# Patient Record
Sex: Male | Born: 1960 | Race: White | Hispanic: No | Marital: Single | Smoking: Current every day smoker
Health system: Southern US, Community
[De-identification: ages and names within clinical notes are randomized; demographics above are authoritative.]

## PROBLEM LIST (undated history)

## (undated) DIAGNOSIS — J45909 Unspecified asthma, uncomplicated: Secondary | ICD-10-CM

## (undated) DIAGNOSIS — E119 Type 2 diabetes mellitus without complications: Secondary | ICD-10-CM

## (undated) DIAGNOSIS — I10 Essential (primary) hypertension: Secondary | ICD-10-CM

## (undated) HISTORY — PX: HERNIA REPAIR: SHX51

---

## 2009-05-08 ENCOUNTER — Emergency Department: Payer: Self-pay | Admitting: Emergency Medicine

## 2009-10-17 ENCOUNTER — Emergency Department: Payer: Self-pay | Admitting: Emergency Medicine

## 2010-03-08 ENCOUNTER — Emergency Department: Payer: Self-pay | Admitting: Emergency Medicine

## 2010-12-26 ENCOUNTER — Emergency Department: Payer: Self-pay | Admitting: Emergency Medicine

## 2011-03-24 ENCOUNTER — Emergency Department: Payer: Self-pay | Admitting: Emergency Medicine

## 2011-04-19 ENCOUNTER — Emergency Department: Payer: Self-pay | Admitting: *Deleted

## 2011-07-06 ENCOUNTER — Emergency Department: Payer: Self-pay | Admitting: Emergency Medicine

## 2011-07-06 LAB — COMPREHENSIVE METABOLIC PANEL
Albumin: 4 g/dL (ref 3.4–5.0)
Anion Gap: 12 (ref 7–16)
Bilirubin,Total: 0.4 mg/dL (ref 0.2–1.0)
Calcium, Total: 9.2 mg/dL (ref 8.5–10.1)
Creatinine: 0.65 mg/dL (ref 0.60–1.30)
Glucose: 131 mg/dL — ABNORMAL HIGH (ref 65–99)
Osmolality: 282 (ref 275–301)
Potassium: 4 mmol/L (ref 3.5–5.1)
SGOT(AST): 35 U/L (ref 15–37)
Sodium: 141 mmol/L (ref 136–145)
Total Protein: 7.9 g/dL (ref 6.4–8.2)

## 2011-07-06 LAB — CBC
HCT: 46.6 % (ref 40.0–52.0)
HGB: 16.3 g/dL (ref 13.0–18.0)
MCH: 32.8 pg (ref 26.0–34.0)
MCHC: 34.9 g/dL (ref 32.0–36.0)
WBC: 8.5 10*3/uL (ref 3.8–10.6)

## 2011-07-11 ENCOUNTER — Emergency Department: Payer: Self-pay | Admitting: Emergency Medicine

## 2011-07-11 LAB — CBC
HCT: 45.7 % (ref 40.0–52.0)
HGB: 15.7 g/dL (ref 13.0–18.0)
MCH: 32.5 pg (ref 26.0–34.0)
MCHC: 34.4 g/dL (ref 32.0–36.0)
MCV: 95 fL (ref 80–100)
Platelet: 161 10*3/uL (ref 150–440)
WBC: 6.2 10*3/uL (ref 3.8–10.6)

## 2011-07-11 LAB — BASIC METABOLIC PANEL
Calcium, Total: 8.8 mg/dL (ref 8.5–10.1)
Co2: 24 mmol/L (ref 21–32)
EGFR (Non-African Amer.): >60
Glucose: 162 mg/dL — ABNORMAL HIGH (ref 65–99)
Osmolality: 281 (ref 275–301)
Potassium: 4.2 mmol/L (ref 3.5–5.1)

## 2011-08-13 ENCOUNTER — Emergency Department: Payer: Self-pay | Admitting: Emergency Medicine

## 2012-04-02 ENCOUNTER — Emergency Department: Payer: Self-pay | Admitting: Emergency Medicine

## 2012-04-02 LAB — BASIC METABOLIC PANEL
Anion Gap: 7 (ref 7–16)
BUN: 12 mg/dL (ref 7–18)
Chloride: 104 mmol/L (ref 98–107)
Co2: 28 mmol/L (ref 21–32)
Creatinine: 0.85 mg/dL (ref 0.60–1.30)
EGFR (Non-African Amer.): >60
Glucose: 224 mg/dL — ABNORMAL HIGH (ref 65–99)
Sodium: 139 mmol/L (ref 136–145)

## 2012-04-02 LAB — CBC
MCH: 33 pg (ref 26.0–34.0)
MCV: 93 fL (ref 80–100)
Platelet: 167 10*3/uL (ref 150–440)
RDW: 12.5 % (ref 11.5–14.5)
WBC: 7.4 10*3/uL (ref 3.8–10.6)

## 2012-04-02 LAB — TROPONIN I
Troponin-I: <0.02 ng/mL
Troponin-I: <0.02 ng/mL

## 2012-08-23 ENCOUNTER — Emergency Department: Payer: Self-pay | Admitting: Emergency Medicine

## 2012-08-23 LAB — COMPREHENSIVE METABOLIC PANEL
Anion Gap: 6 — ABNORMAL LOW (ref 7–16)
BUN: 10 mg/dL (ref 7–18)
Calcium, Total: 8.1 mg/dL — ABNORMAL LOW (ref 8.5–10.1)
Chloride: 104 mmol/L (ref 98–107)
Creatinine: 0.71 mg/dL (ref 0.60–1.30)
EGFR (African American): >60
EGFR (Non-African Amer.): >60
Glucose: 335 mg/dL — ABNORMAL HIGH (ref 65–99)
Osmolality: 284 (ref 275–301)
Potassium: 3.9 mmol/L (ref 3.5–5.1)
SGOT(AST): 40 U/L — ABNORMAL HIGH (ref 15–37)
SGPT (ALT): 64 U/L (ref 12–78)
Sodium: 136 mmol/L (ref 136–145)
Total Protein: 7.6 g/dL (ref 6.4–8.2)

## 2012-08-23 LAB — URINALYSIS, COMPLETE
Blood: NEGATIVE
Ketone: NEGATIVE
Leukocyte Esterase: NEGATIVE
Nitrite: NEGATIVE
Ph: 6 (ref 4.5–8.0)
Protein: NEGATIVE
RBC,UR: <1 /HPF (ref 0–5)
Specific Gravity: 1.036 (ref 1.003–1.030)
Squamous Epithelial: 1

## 2012-08-23 LAB — DRUG SCREEN, URINE
MDMA (Ecstasy)Ur Screen: NEGATIVE (ref ?–500)
Opiate, Ur Screen: POSITIVE (ref ?–300)
Phencyclidine (PCP) Ur S: NEGATIVE (ref ?–25)
Tricyclic, Ur Screen: NEGATIVE (ref ?–1000)

## 2012-08-23 LAB — CBC
MCHC: 35.1 g/dL (ref 32.0–36.0)
MCV: 94 fL (ref 80–100)
RDW: 12.8 % (ref 11.5–14.5)

## 2012-08-23 LAB — ETHANOL: Ethanol: <3 mg/dL

## 2012-10-10 ENCOUNTER — Emergency Department: Payer: Self-pay | Admitting: Emergency Medicine

## 2012-10-10 LAB — COMPREHENSIVE METABOLIC PANEL
Albumin: 3.8 g/dL (ref 3.4–5.0)
BUN: 12 mg/dL (ref 7–18)
Bilirubin,Total: 0.7 mg/dL (ref 0.2–1.0)
Calcium, Total: 8.8 mg/dL (ref 8.5–10.1)
Chloride: 104 mmol/L (ref 98–107)
Co2: 24 mmol/L (ref 21–32)
Creatinine: 0.78 mg/dL (ref 0.60–1.30)
EGFR (African American): >60
EGFR (Non-African Amer.): >60
Glucose: 305 mg/dL — ABNORMAL HIGH (ref 65–99)
Osmolality: 283 (ref 275–301)
Potassium: 3.8 mmol/L (ref 3.5–5.1)
SGOT(AST): 59 U/L — ABNORMAL HIGH (ref 15–37)
SGPT (ALT): 78 U/L (ref 12–78)
Sodium: 136 mmol/L (ref 136–145)
Total Protein: 7.3 g/dL (ref 6.4–8.2)

## 2012-10-10 LAB — CBC
MCH: 34 pg (ref 26.0–34.0)
Platelet: 204 10*3/uL (ref 150–440)
RBC: 5.1 10*6/uL (ref 4.40–5.90)
RDW: 12.9 % (ref 11.5–14.5)
WBC: 8.6 10*3/uL (ref 3.8–10.6)

## 2012-10-23 ENCOUNTER — Emergency Department: Payer: Self-pay | Admitting: Emergency Medicine

## 2012-11-04 ENCOUNTER — Emergency Department: Payer: Self-pay

## 2012-11-04 LAB — URINALYSIS, COMPLETE
Bacteria: NONE SEEN
Bilirubin,UR: NEGATIVE
Glucose,UR: >=500 mg/dL
Hyaline Cast: 12
Ketone: NEGATIVE
Leukocyte Esterase: NEGATIVE
Nitrite: NEGATIVE
Ph: 5
Protein: 100
RBC,UR: 3 /HPF
Specific Gravity: 1.036
Squamous Epithelial: 3
WBC UR: 3 /HPF

## 2012-11-04 LAB — CBC
HCT: 49.3 %
HGB: 18 g/dL
MCH: 33.5 pg
MCHC: 36.4 g/dL — ABNORMAL HIGH
MCV: 92 fL
Platelet: 210 x10 3/mm 3
RBC: 5.37 x10 6/mm 3
RDW: 13 %
WBC: 13.2 x10 3/mm 3 — ABNORMAL HIGH

## 2012-11-04 LAB — COMPREHENSIVE METABOLIC PANEL
Albumin: 4.3 g/dL (ref 3.4–5.0)
Alkaline Phosphatase: 91 U/L (ref 50–136)
Anion Gap: 13 (ref 7–16)
BUN: 15 mg/dL (ref 7–18)
Bilirubin,Total: 0.6 mg/dL (ref 0.2–1.0)
Calcium, Total: 9.8 mg/dL (ref 8.5–10.1)
Chloride: 99 mmol/L (ref 98–107)
Creatinine: 1.09 mg/dL (ref 0.60–1.30)
EGFR (African American): >60
EGFR (Non-African Amer.): >60
Glucose: 384 mg/dL — ABNORMAL HIGH (ref 65–99)
Osmolality: 276 (ref 275–301)
Potassium: 4.2 mmol/L (ref 3.5–5.1)
Sodium: 129 mmol/L — ABNORMAL LOW (ref 136–145)

## 2012-11-04 LAB — LIPASE, BLOOD: Lipase: 209 U/L (ref 73–393)

## 2012-11-12 ENCOUNTER — Emergency Department: Payer: Self-pay | Admitting: Emergency Medicine

## 2012-11-12 LAB — URINALYSIS, COMPLETE
Bacteria: NONE SEEN
Blood: NEGATIVE
Glucose,UR: >=500 mg/dL (ref 0–75)
Hyaline Cast: 1
Leukocyte Esterase: NEGATIVE
Nitrite: NEGATIVE
Ph: 5 (ref 4.5–8.0)
Protein: 30
RBC,UR: 1 /HPF (ref 0–5)
Specific Gravity: 1.037 (ref 1.003–1.030)
Squamous Epithelial: 1
WBC UR: 1 /HPF (ref 0–5)

## 2012-11-12 LAB — CBC
HCT: 44.7 % (ref 40.0–52.0)
MCHC: 36.1 g/dL — ABNORMAL HIGH (ref 32.0–36.0)
MCV: 92 fL (ref 80–100)
Platelet: 176 10*3/uL (ref 150–440)
RBC: 4.87 10*6/uL (ref 4.40–5.90)
WBC: 7.5 10*3/uL (ref 3.8–10.6)

## 2012-11-12 LAB — COMPREHENSIVE METABOLIC PANEL
Anion Gap: 6 — ABNORMAL LOW (ref 7–16)
BUN: 12 mg/dL (ref 7–18)
Bilirubin,Total: 0.4 mg/dL (ref 0.2–1.0)
Calcium, Total: 9.4 mg/dL (ref 8.5–10.1)
EGFR (African American): >60
EGFR (Non-African Amer.): >60
Glucose: 238 mg/dL — ABNORMAL HIGH (ref 65–99)
Potassium: 3.8 mmol/L (ref 3.5–5.1)
SGPT (ALT): 61 U/L (ref 12–78)
Sodium: 134 mmol/L — ABNORMAL LOW (ref 136–145)
Total Protein: 7.3 g/dL (ref 6.4–8.2)

## 2013-01-01 ENCOUNTER — Emergency Department: Payer: Self-pay | Admitting: Emergency Medicine

## 2013-01-01 LAB — DRUG SCREEN, URINE
Amphetamines, Ur Screen: NEGATIVE (ref ?–1000)
Benzodiazepine, Ur Scrn: NEGATIVE (ref ?–200)
Cannabinoid 50 Ng, Ur ~~LOC~~: NEGATIVE (ref ?–50)
Methadone, Ur Screen: NEGATIVE (ref ?–300)
Opiate, Ur Screen: POSITIVE (ref ?–300)
Phencyclidine (PCP) Ur S: NEGATIVE (ref ?–25)

## 2013-01-01 LAB — URINALYSIS, COMPLETE
Bacteria: NONE SEEN
Blood: NEGATIVE
Ketone: NEGATIVE
Leukocyte Esterase: NEGATIVE
Nitrite: NEGATIVE
Specific Gravity: 1.024 (ref 1.003–1.030)
WBC UR: 1 /HPF (ref 0–5)

## 2013-01-01 LAB — COMPREHENSIVE METABOLIC PANEL
Albumin: 4.1 g/dL (ref 3.4–5.0)
Alkaline Phosphatase: 84 U/L (ref 50–136)
Anion Gap: 5 — ABNORMAL LOW (ref 7–16)
BUN: 12 mg/dL (ref 7–18)
Calcium, Total: 8.8 mg/dL (ref 8.5–10.1)
Chloride: 104 mmol/L (ref 98–107)
Creatinine: 0.72 mg/dL (ref 0.60–1.30)
EGFR (Non-African Amer.): >60
SGPT (ALT): 38 U/L (ref 12–78)
Sodium: 137 mmol/L (ref 136–145)

## 2013-01-01 LAB — TSH: Thyroid Stimulating Horm: 2.02 u[IU]/mL

## 2013-01-01 LAB — CBC
HGB: 16.4 g/dL (ref 13.0–18.0)
MCH: 32.4 pg (ref 26.0–34.0)
MCHC: 35.1 g/dL (ref 32.0–36.0)
MCV: 92 fL (ref 80–100)
Platelet: 179 10*3/uL (ref 150–440)
RDW: 12.6 % (ref 11.5–14.5)
WBC: 7.8 10*3/uL (ref 3.8–10.6)

## 2013-01-01 LAB — SALICYLATE LEVEL: Salicylates, Serum: 4.8 mg/dL — ABNORMAL HIGH

## 2013-01-01 LAB — ACETAMINOPHEN LEVEL: Acetaminophen: <2 ug/mL

## 2013-01-01 LAB — ETHANOL: Ethanol: <3 mg/dL

## 2013-01-26 ENCOUNTER — Emergency Department: Payer: Self-pay | Admitting: Emergency Medicine

## 2013-08-17 ENCOUNTER — Emergency Department: Payer: Self-pay | Admitting: Emergency Medicine

## 2014-01-20 ENCOUNTER — Emergency Department: Payer: Self-pay | Admitting: Emergency Medicine

## 2014-01-20 LAB — URINALYSIS, COMPLETE
BILIRUBIN, UR: NEGATIVE
Bacteria: NONE SEEN
Blood: NEGATIVE
LEUKOCYTE ESTERASE: NEGATIVE
Nitrite: NEGATIVE
Ph: 6 (ref 4.5–8.0)
Protein: NEGATIVE
RBC,UR: 2 /HPF (ref 0–5)
Specific Gravity: 1.027 (ref 1.003–1.030)
Squamous Epithelial: 1
WBC UR: 2 /HPF (ref 0–5)

## 2014-01-20 LAB — CBC WITH DIFFERENTIAL/PLATELET
Basophil #: 0.1 10*3/uL (ref 0.0–0.1)
Basophil %: 1.4 %
EOS ABS: 0.1 10*3/uL (ref 0.0–0.7)
Eosinophil %: 1.9 %
HCT: 46.4 % (ref 40.0–52.0)
HGB: 16 g/dL (ref 13.0–18.0)
Lymphocyte #: 2.7 10*3/uL (ref 1.0–3.6)
Lymphocyte %: 36.7 %
MCH: 32.8 pg (ref 26.0–34.0)
MCHC: 34.6 g/dL (ref 32.0–36.0)
MCV: 95 fL (ref 80–100)
MONO ABS: 0.5 x10 3/mm (ref 0.2–1.0)
Monocyte %: 6.8 %
NEUTROS ABS: 3.9 10*3/uL (ref 1.4–6.5)
Neutrophil %: 53.2 %
Platelet: 148 10*3/uL — ABNORMAL LOW (ref 150–440)
RBC: 4.89 10*6/uL (ref 4.40–5.90)
RDW: 13 % (ref 11.5–14.5)
WBC: 7.4 10*3/uL (ref 3.8–10.6)

## 2014-01-20 LAB — BASIC METABOLIC PANEL
Anion Gap: 6 — ABNORMAL LOW (ref 7–16)
BUN: 9 mg/dL (ref 7–18)
CALCIUM: 7.8 mg/dL — AB (ref 8.5–10.1)
Chloride: 104 mmol/L (ref 98–107)
Co2: 24 mmol/L (ref 21–32)
Creatinine: 0.73 mg/dL (ref 0.60–1.30)
EGFR (African American): >60
EGFR (Non-African Amer.): >60
Glucose: 239 mg/dL — ABNORMAL HIGH (ref 65–99)
Osmolality: 275 (ref 275–301)
POTASSIUM: 3.9 mmol/L (ref 3.5–5.1)
Sodium: 134 mmol/L — ABNORMAL LOW (ref 136–145)

## 2014-01-20 LAB — TROPONIN I

## 2014-04-07 ENCOUNTER — Ambulatory Visit: Payer: Self-pay | Admitting: Surgery

## 2014-04-12 ENCOUNTER — Ambulatory Visit: Payer: Self-pay | Admitting: Surgery

## 2014-09-30 NOTE — Consult Note (Signed)
PATIENT NAME:  Michael Santana, Michael Santana MR#:  [id number] DATE OF BIRTH:  10/21/1960  DATE OF CONSULTATION:  10/10/2012  REFERRING PHYSICIAN:   CONSULTING PHYSICIAN:  Cristal Deerhristopher A. Divine Hansley, MD  REASON FOR CONSULTATION:  Abdominal hernias and recurrent pain with heavy lifting.   HISTORY OF PRESENT ILLNESS:  The patient is a pleasant unfortunate 54 year old male who for the last 1-1/2 years says that he has noticed multiple bulges in his abdominal wall particularly with heavy lifting and which have gotten worse particularly over the last 6 and 7 months.  He presents here after worsening abdominal pain, but currently appears comfortable.  He says that he has seen a [facility] advisers[facility] in [place] before and was told that he has a problem with his left ventricle which prohibits him for surgery.  Today he was lifting while taking care of his girlfriend who I believe has brain cancer and says that he felt a bulge.  He says that he has this problem multiple times in the past and there are times occasionally where it becomes extremely hard and sometimes is associated with nausea, vomiting and sometimes his vomiting smells feculent according to him.  Says that he is also constipated, otherwise no fevers, chills, night sweats, shortness of breath, cough, chest pain, current nausea, vomiting or diarrhea.  No dysuria, hematuria.  Says that he has increasing pain in his legs and that his diabetes is poorly-controlled.    PAST MEDICAL HISTORY: 1.  History of abdominal hernia with pain pain.  2.  History of chronic back pain.  3.  History of degenerative joint disease.  4.  History of hypercholesterolemia.  5.  History of asthma.  6.  Diabetes mellitus.  7.  Hypertension.  8.  History of cardiac catheterization for which he cannot elaborate.   HOME MEDICINES:  1.  As needed Vicodin or Percocet.  2.  Ultram as needed.  3.  Protonix 40 mg by mouth daily.  4.  Metformin 1000 mg by mouth twice daily.  5.  Lisinopril 10 mg by  mouth daily.  6.  Glucophage 500 mg by mouth twice daily.  7.  Benadryl 1 tab orally 3 times daily.  8.  Aspirin 81 by mouth daily.    I am uncertain which of these medications he currently takes, although I suspect none because he has stressed his financial issues on numerous occasions.   ALLERGIES:  No known drug allergies.   SOCIAL HISTORY:  Lives in [place], takes care of his girlfriend, smokes one-half a day.  Denies alcohol or drug use.  Is unemployed.   FAMILY HISTORY:  1.  Coronary artery disease.  2.  Brain cancer, mother.  3.  Diabetes mellitus.  4.  Hypertension.   REVIEW OF SYSTEMS:  A 12 point review of systems was obtained.  Pertinent positives and negatives as above.    PHYSICAL EXAMINATION: VITAL SIGNS:  Temperature 96.8, pulse 94, blood pressure 122/82, respirations 20, 95%.  GENERAL:  No acute distress, alert and oriented x 3, interactive, talkative.  HEAD:  Normocephalic, atraumatic.  EYES:  No scleral icterus.  No conjunctivitis.  CHEST:  Lungs clear to auscultation, moving air well.  HEART:  Regular rate and rhythm.  No murmurs, rubs, or gallops.  ABDOMEN:  Soft, minimally tender, minimally distended.  I do feel a bulge superiorly into his umbilicus.  It has the appearance of an umbilical hernia.  I do not feel any other bulges.  He does appear to have a diastases  recti superiorly as well.  EXTREMITIES:  Strength 5 out of 5 in all four extremities.  Moves all extremities well.   NEUROLOGIC:  Cranial nerves II through XII grossly intact.  Sensation intact to all four extremities.   LABORATORY DATA:  White cell count of 8.6, hemoglobin 17.3, hematocrit 47.1, platelets 204.  LFTs are normal.  BMP is within normal limits.  Glucose is 305.   ASSESSMENT AND PLAN:  The patient is a pleasant 55 year old male with what sounds like a chronic ventral hernia.  There are no emergent indications for surgery.  I will be happy to evaluate this nice patient for a possible hernia  repair as an outpatient.  Will need, with his story of having heart problems, cardiac clearance and I would like better blood sugar control prior to operation.  I would also tell him he would need to minimize smoking and he would not be able to lift for 6 weeks afterwards which would increase his risk of recurrent hernia.  I have warned him of signs and symptoms of incarceration which should make him emergently either call my office or return to the Emergency Room.  We will give him contact information to call me as an outpatient.    ____________________________ Si Raider. Naveah Brave, MD cal:ea D: 10/10/2012 23:48:39 ET T: 10/11/2012 01:14:13 ET JOB#: [id number]  cc: Cristal Deer A. Kellsie Grindle, MD, <Dictator> Jarvis Newcomer MD ELECTRONICALLY SIGNED 10/11/2012 20:07

## 2014-10-01 NOTE — Op Note (Signed)
PATIENT NAME:  Michael Santana, Michael Santana MR#:  [id number] DATE OF BIRTH:  1960/11/12  DATE OF PROCEDURE:  04/12/2014  PREOPERATIVE DIAGNOSIS: Ventral hernia, umbilical hernia.   POSTOPERATIVE DIAGNOSIS: Ventral hernia, umbilical hernia.   PROCEDURE: Ventral hernia repair and umbilical hernia repair.   SURGEON: Renda Rolls, MD  ANESTHESIA: General.   INDICATIONS: This 54 year old male had a history of epigastric pain, findings of a ventral hernia, also had an umbilical hernia. Repair of each hernia was recommended for definitive treatment.   DESCRIPTION OF PROCEDURE: The patient was placed on the operating table in the supine position under general endotracheal anesthesia. The abdomen was clipped and prepared with ChloraPrep and draped in a sterile manner.   An upper abdominal midline incision was made some 6 cm in length so that the lower end of the incision was 3 cm above the umbilicus. The incision was carried down through a thin layer of subcutaneous tissues to encounter a ventral hernia sac which appeared to consist of properitoneal fat. There were 3 clusters of fat which were all dissected free from surrounding structures. There were actually 2 ventral hernia defects separated by a bridge of fascia which was about 6 mm. This bridge was incised to convert the 2 ventral hernias into 1 ventral hernia. The incarcerated tissues were dissected free from surrounding structures, up to the fascial ring defect, and all were inverted. The properitoneal fat was separated from the fascia circumferentially extending back approximately 1.5 cm. A finger was inserted. There was no other palpable hernia defect in the immediate area. Next, a Bard soft mesh was cut to create a circular shape of some 4 cm in diameter and this was placed into the properitoneal plane. It was sutured to the overlying fascia with 0 Surgilon through and through sutures. It was also recognized the patient had moderate diastasis recti. The repair  was further carried out with a transversely oriented suture line of interrupted 0 Surgilon figure-of-eight sutures incorporating mesh into each suture. The repair looked good. Hemostasis was intact. The subcutaneous tissues were infiltrated with 0.5% Sensorcaine with epinephrine. Subcutaneous tissues were approximated with interrupted 3-0 chromic inverted sutures. The skin was closed with running 4-0 Monocryl subcuticular suture.   Next, attention was turned to the umbilicus where an infraumbilical incision was made, which was curvilinear and oriented transversely, approximately 3.5 cm in length, carried down through subcutaneous tissues to encounter an umbilical hernia sac. The sac was dissected circumferentially and dissected away from the skin of the umbilicus. It was also transected just below the umbilicus. The hernia sac was closed with a 3-0 chromic suture ligature. The sac was dissected free from the fascial ring defect. The defect itself was approximately 1.2 cm in dimension. The sac was inverted. The properitoneal fat was dissected away from the fascial ring defect circumferentially. A Bard soft mesh was cut to create a circular shape of some 1.8 cm in diameter. This was placed into the properitoneal plane and sutured to the overlying fascia with 0 Surgilon through and through sutures. Next, the repair was further carried out with a row of 0 Surgilon figure-of-eight sutures, oriented transversely with each suture incorporated into the mesh. The repair looked good. The subcutaneous tissues were infiltrated with 0.5% Sensorcaine with epinephrine. A 3-0 chromic pursestring suture was placed into the subcutaneous tissues just above the fascia and also sutured to the skin of the umbilicus and tied down. Next, the additional subcutaneous tissues were approximated with 3-0 chromic. The skin was closed with  running 4-0 Monocryl subcuticular suture.   Both wounds were treated with Dermabond and allowed to dry.  The patient tolerated surgery satisfactorily and was prepared for transfer to the recovery room.  ____________________________ Shela CommonsJ. Renda RollsWilton Mathhew Buysse, MD jws:sb D: 04/12/2014 09:22:09 ET T: 04/12/2014 09:52:15 ET JOB#: [id number]  cc: Adella HareJ. Wilton Noha Karasik, MD, <Dictator> Adella HareWILTON J Gerilynn Mccullars MD ELECTRONICALLY SIGNED 04/16/2014 12:13

## 2014-10-31 ENCOUNTER — Other Ambulatory Visit: Payer: Self-pay | Admitting: Surgery

## 2014-10-31 DIAGNOSIS — R1084 Generalized abdominal pain: Secondary | ICD-10-CM

## 2014-10-31 DIAGNOSIS — R140 Abdominal distension (gaseous): Secondary | ICD-10-CM

## 2014-11-02 ENCOUNTER — Ambulatory Visit
Admission: RE | Admit: 2014-11-02 | Discharge: 2014-11-02 | Disposition: A | Discharge status: Home / Self Care | Payer: Medicaid Other | Source: Ambulatory Visit | Attending: Surgery | Admitting: Surgery

## 2014-11-02 DIAGNOSIS — K760 Fatty (change of) liver, not elsewhere classified: Secondary | ICD-10-CM | POA: Diagnosis not present

## 2014-11-02 DIAGNOSIS — R140 Abdominal distension (gaseous): Secondary | ICD-10-CM

## 2014-11-02 DIAGNOSIS — K5730 Diverticulosis of large intestine without perforation or abscess without bleeding: Secondary | ICD-10-CM | POA: Insufficient documentation

## 2014-11-02 DIAGNOSIS — R1084 Generalized abdominal pain: Secondary | ICD-10-CM | POA: Diagnosis present

## 2014-11-02 HISTORY — DX: Essential (primary) hypertension: I10

## 2014-11-02 HISTORY — DX: Type 2 diabetes mellitus without complications: E11.9

## 2014-11-02 HISTORY — DX: Unspecified asthma, uncomplicated: J45.909

## 2014-11-02 MED ORDER — IOHEXOL 350 MG/ML SOLN
100.0000 mL | Freq: Once | INTRAVENOUS | Status: AC | PRN
  Administered 2014-11-02: 100 mL via INTRAVENOUS

## 2015-01-18 ENCOUNTER — Other Ambulatory Visit: Payer: Self-pay | Admitting: Anesthesiology

## 2015-01-18 DIAGNOSIS — S335XXA Sprain of ligaments of lumbar spine, initial encounter: Secondary | ICD-10-CM

## 2015-01-18 DIAGNOSIS — S339XXA Sprain of unspecified parts of lumbar spine and pelvis, initial encounter: Secondary | ICD-10-CM

## 2015-01-18 DIAGNOSIS — R202 Paresthesia of skin: Secondary | ICD-10-CM

## 2015-01-18 DIAGNOSIS — M5440 Lumbago with sciatica, unspecified side: Secondary | ICD-10-CM

## 2015-01-18 DIAGNOSIS — M545 Low back pain, unspecified: Secondary | ICD-10-CM

## 2015-01-18 DIAGNOSIS — R200 Anesthesia of skin: Secondary | ICD-10-CM

## 2015-01-18 DIAGNOSIS — M5136 Other intervertebral disc degeneration, lumbar region: Secondary | ICD-10-CM

## 2015-02-07 ENCOUNTER — Ambulatory Visit: Admission: RE | Admit: 2015-02-07 | Payer: Medicaid Other | Source: Ambulatory Visit

## 2015-02-21 ENCOUNTER — Ambulatory Visit: Payer: Medicaid Other

## 2015-02-23 ENCOUNTER — Ambulatory Visit: Admission: RE | Admit: 2015-02-23 | Payer: Medicaid Other | Source: Ambulatory Visit

## 2015-02-28 ENCOUNTER — Encounter: Payer: Self-pay | Admitting: Emergency Medicine

## 2015-02-28 ENCOUNTER — Emergency Department
Admission: EM | Admit: 2015-02-28 | Discharge: 2015-02-28 | Disposition: A | Discharge status: Home / Self Care | Payer: Medicaid Other | Attending: Emergency Medicine | Admitting: Emergency Medicine

## 2015-02-28 DIAGNOSIS — Z79899 Other long term (current) drug therapy: Secondary | ICD-10-CM | POA: Diagnosis not present

## 2015-02-28 DIAGNOSIS — I10 Essential (primary) hypertension: Secondary | ICD-10-CM | POA: Diagnosis not present

## 2015-02-28 DIAGNOSIS — R109 Unspecified abdominal pain: Secondary | ICD-10-CM | POA: Diagnosis present

## 2015-02-28 DIAGNOSIS — Z720 Tobacco use: Secondary | ICD-10-CM | POA: Insufficient documentation

## 2015-02-28 DIAGNOSIS — E119 Type 2 diabetes mellitus without complications: Secondary | ICD-10-CM | POA: Diagnosis not present

## 2015-02-28 DIAGNOSIS — K439 Ventral hernia without obstruction or gangrene: Secondary | ICD-10-CM | POA: Insufficient documentation

## 2015-02-28 LAB — COMPREHENSIVE METABOLIC PANEL
ALT: 46 U/L (ref 17–63)
ANION GAP: 6 (ref 5–15)
AST: 43 U/L — ABNORMAL HIGH (ref 15–41)
Albumin: 4.6 g/dL (ref 3.5–5.0)
Alkaline Phosphatase: 65 U/L (ref 38–126)
BILIRUBIN TOTAL: 0.9 mg/dL (ref 0.3–1.2)
BUN: 10 mg/dL (ref 6–20)
CHLORIDE: 105 mmol/L (ref 101–111)
CO2: 27 mmol/L (ref 22–32)
Calcium: 9.1 mg/dL (ref 8.9–10.3)
Creatinine, Ser: 0.67 mg/dL (ref 0.61–1.24)
GFR calc Af Amer: >60 mL/min (ref >60)
Glucose, Bld: 123 mg/dL — ABNORMAL HIGH (ref 65–99)
POTASSIUM: 3.5 mmol/L (ref 3.5–5.1)
Sodium: 138 mmol/L (ref 135–145)
TOTAL PROTEIN: 8 g/dL (ref 6.5–8.1)

## 2015-02-28 LAB — URINALYSIS COMPLETE WITH MICROSCOPIC (ARMC ONLY)
BACTERIA UA: NONE SEEN
Bilirubin Urine: NEGATIVE
GLUCOSE, UA: NEGATIVE mg/dL
HGB URINE DIPSTICK: NEGATIVE
Ketones, ur: NEGATIVE mg/dL
LEUKOCYTES UA: NEGATIVE
NITRITE: NEGATIVE
PH: 5 (ref 5.0–8.0)
PROTEIN: 30 mg/dL — AB
SPECIFIC GRAVITY, URINE: 1.015 (ref 1.005–1.030)

## 2015-02-28 LAB — CBC
HEMATOCRIT: 46.4 % (ref 40.0–52.0)
HEMOGLOBIN: 16.3 g/dL (ref 13.0–18.0)
MCH: 31.9 pg (ref 26.0–34.0)
MCHC: 35.1 g/dL (ref 32.0–36.0)
MCV: 90.9 fL (ref 80.0–100.0)
Platelets: 164 10*3/uL (ref 150–440)
RBC: 5.11 MIL/uL (ref 4.40–5.90)
RDW: 13.3 % (ref 11.5–14.5)
WBC: 8 10*3/uL (ref 3.8–10.6)

## 2015-02-28 LAB — LIPASE, BLOOD: LIPASE: 44 U/L (ref 22–51)

## 2015-02-28 MED ORDER — OXYCODONE-ACETAMINOPHEN 5-325 MG PO TABS
ORAL_TABLET | ORAL | Status: AC
  Filled 2015-02-28: qty 1

## 2015-02-28 MED ORDER — OXYCODONE-ACETAMINOPHEN 5-325 MG PO TABS
ORAL_TABLET | ORAL | Status: DC
Start: 2015-02-28 — End: 2015-02-28
  Filled 2015-02-28: qty 1

## 2015-02-28 MED ORDER — OXYCODONE-ACETAMINOPHEN 5-325 MG PO TABS: 1.0000 | ORAL_TABLET | Freq: Four times a day (QID) | ORAL | Status: DC | PRN

## 2015-02-28 MED ORDER — OXYCODONE-ACETAMINOPHEN 5-325 MG PO TABS
2.0000 | ORAL_TABLET | Freq: Once | ORAL | Status: AC
  Administered 2015-02-28: 2 via ORAL

## 2015-02-28 NOTE — Discharge Instructions (Signed)
You were prescribed a medication that is potentially sedating. Do not drink alcohol, drive or participate in any other potentially dangerous activities while taking this medication as it may make you sleepy. Do not take this medication with any other sedating medications, either prescription or over-the-counter. If you were prescribed Percocet or Vicodin, do not take these with acetaminophen (Tylenol) as it is already contained within these medications.   Opioid pain medications (or "narcotics") can be habit forming.  Use it as little as possible to achieve adequate pain control.  Do not use or use it with extreme caution if you have a history of opiate abuse or dependence.  If you are on a pain contract with your primary care doctor or a pain specialist, be sure to let them know you were prescribed this medication today from the [facility] Emergency Department.  This medication is intended for your use only - do not give any to anyone else and keep it in a secure place where nobody else, especially children and pets, have access to it.  It will also cause or worsen constipation, so you may want to consider taking an over-the-counter stool softener while you are taking this medication.   Hernia A hernia occurs when an internal organ pushes out through a weak spot in the abdominal wall. Hernias most commonly occur in the groin and around the navel. Hernias often can be pushed back into place (reduced). Most hernias tend to get worse over time. Some abdominal hernias can get stuck in the opening (irreducible or incarcerated hernia) and cannot be reduced. An irreducible abdominal hernia which is tightly squeezed into the opening is at risk for impaired blood supply (strangulated hernia). A strangulated hernia is a medical emergency. Because of the risk for an irreducible or strangulated hernia, surgery may be recommended to repair a hernia. CAUSES   Heavy lifting.  Prolonged coughing.  Straining to  have a bowel movement.  A cut (incision) made during an abdominal surgery. HOME CARE INSTRUCTIONS   Bed rest is not required. You may continue your normal activities.  Avoid lifting more than 10 pounds (4.5 kg) or straining.  Cough gently. If you are a smoker it is best to stop. Even the best hernia repair can break down with the continual strain of coughing. Even if you do not have your hernia repaired, a cough will continue to aggravate the problem.  Do not wear anything tight over your hernia. Do not try to keep it in with an outside bandage or truss. These can damage abdominal contents if they are trapped within the hernia sac.  Eat a normal diet.  Avoid constipation. Straining over long periods of time will increase hernia size and encourage breakdown of repairs. If you cannot do this with diet alone, stool softeners may be used. SEEK IMMEDIATE MEDICAL CARE IF:   You have a fever.  You develop increasing abdominal pain.  You feel nauseous or vomit.  Your hernia is stuck outside the abdomen, looks discolored, feels hard, or is tender.  You have any changes in your bowel habits or in the hernia that are unusual for you.  You have increased pain or swelling around the hernia.  You cannot push the hernia back in place by applying gentle pressure while lying down. MAKE SURE YOU:   Understand these instructions.  Will watch your condition.  Will get help right away if you are not doing well or get worse. Document Released: 05/27/2005 Document Revised: 08/19/2011 Document  Reviewed: 01/14/2008 ExitCare Patient Information 2015 [place], [place]. This information is not intended to replace advice given to you by your health care provider. Make sure you discuss any questions you have with your health care provider.

## 2015-02-28 NOTE — ED Notes (Signed)
Pt reports abdominal pain "for months", pt reports had a double hernia repair above umbilicus, reports continued pain and surgeon won't do anything for it.

## 2015-02-28 NOTE — ED Provider Notes (Signed)
[facility] Emergency Department Provider Note  ____________________________________________  Time seen: 3:20 PM  I have reviewed the triage vital signs and the nursing notes.   HISTORY  Chief Complaint Abdominal Pain    HPI Michael Santana is a 54 y.o. male who complains of abdominal wall pain in the area of a prior hernia repair for the past several months. Hernia repair was done by Renda Rolls in the past. Normal bowel movements including one today. No nausea or vomiting. Has pain with movement. Notes a bulge when he stands or activates his abdominal muscles. No heavy lifting or sudden tearing pain. No back pain chest pain shortness of breath. No syncope.     Past Medical History  Diagnosis Date  . Asthma   . Diabetes mellitus without complication   . Hypertension      There are no active problems to display for this patient.    Past Surgical History  Procedure Laterality Date  . Hernia repair       Current Outpatient Rx  Name  Route  Sig  Dispense  Refill  . escitalopram (LEXAPRO) 10 MG tablet   Oral   Take 10 mg by mouth daily.         Marland Kitchen glipiZIDE (GLUCOTROL) 10 MG tablet   Oral   Take 10 mg by mouth 2 (two) times daily.         . metFORMIN (GLUCOPHAGE-XR) 500 MG 24 hr tablet   Oral   Take 1,000 mg by mouth 2 (two) times daily.         Marland Kitchen oxyCODONE-acetaminophen (PERCOCET) 10-325 MG per tablet   Oral   Take 1 tablet by mouth 3 (three) times daily.         Marland Kitchen oxyCODONE-acetaminophen (ROXICET) 5-325 MG per tablet   Oral   Take 1 tablet by mouth every 6 (six) hours as needed for severe pain.   8 tablet   0      Allergies Tramadol   No family history on file.  Social History Social History  Substance Use Topics  . Smoking status: Current Every Day Smoker    Types: Cigarettes  . Smokeless tobacco: None  . Alcohol Use: No    Review of Systems  Constitutional:   No fever or chills. No weight changes Eyes:    No blurry vision or double vision.  ENT:   No sore throat. Cardiovascular:   No chest pain. Respiratory:   No dyspnea or cough. Gastrointestinal:   Abdominal pain as above without vomiting or diarrhea  No BRBPR or melena. Genitourinary:   Negative for dysuria, urinary retention, bloody urine, or difficulty urinating. Musculoskeletal:   Negative for back pain. No joint swelling or pain. Skin:   Negative for rash. Neurological:   Negative for headaches, focal weakness or numbness. Psychiatric:  No anxiety or depression.   Endocrine:  No hot/cold intolerance, changes in energy, or sleep difficulty.  10-point ROS otherwise negative.  ____________________________________________   PHYSICAL EXAM:  VITAL SIGNS: ED Triage Vitals  Enc Vitals Group     BP 02/28/15 1342 154/87 mmHg     Pulse Rate 02/28/15 1342 91     Resp 02/28/15 1342 16     Temp 02/28/15 1342 98.1 F (36.7 C)     Temp Source 02/28/15 1342 Oral     SpO2 02/28/15 1342 96 %     Weight 02/28/15 1342 250 lb (113.399 kg)     Height 02/28/15 1342 6'  1" (1.854 m)     Head Cir --      Peak Flow --      Pain Score 02/28/15 1343 8     Pain Loc --      Pain Edu? --      Excl. in GC? --      Constitutional:   Alert and oriented. Well appearing and in no distress. Eyes:   No scleral icterus. No conjunctival pallor. PERRL. EOMI ENT   Head:   Normocephalic and atraumatic.   Nose:   No congestion/rhinnorhea. No septal hematoma   Mouth/Throat:   MMM, no pharyngeal erythema. No peritonsillar mass. No uvula shift.   Neck:   No stridor. No SubQ emphysema. No meningismus. Hematological/Lymphatic/Immunilogical:   No cervical lymphadenopathy. Cardiovascular:   RRR. Normal and symmetric distal pulses are present in all extremities. No murmurs, rubs, or gallops. Respiratory:   Normal respiratory effort without tachypnea nor retractions. Breath sounds are clear and equal bilaterally. No  wheezes/rales/rhonchi. Gastrointestinal:   Ventral abdominal wall defect in the supraumbilical area. There is a 1 centimeters separation of the fascia at the midline, but it also feels like there is probably an intact mesh below that. There are absolutely no abdominal contents palpable in the area. It is nontender, and there is no mass.. No distention. There is no CVA tenderness.  No rebound, rigidity, or guarding. Genitourinary:   deferred Musculoskeletal:   Nontender with normal range of motion in all extremities. No joint effusions.  No lower extremity tenderness.  No edema. Neurologic:   Normal speech and language.  CN 2-10 normal. Motor grossly intact. No pronator drift.  Normal gait. No gross focal neurologic deficits are appreciated.  Skin:    Skin is warm, dry and intact. No rash noted.  No petechiae, purpura, or bullae. Psychiatric:   Mood and affect are normal. Speech and behavior are normal. Patient exhibits appropriate insight and judgment.  ____________________________________________    LABS (pertinent positives/negatives) (all labs ordered are listed, but only abnormal results are displayed) Labs Reviewed  COMPREHENSIVE METABOLIC PANEL - Abnormal; Notable for the following:    Glucose, Bld 123 (*)    AST 43 (*)    All other components within normal limits  LIPASE, BLOOD  CBC  URINALYSIS COMPLETEWITH MICROSCOPIC ([facility] ONLY)   ____________________________________________   EKG    ____________________________________________    RADIOLOGY    ____________________________________________   PROCEDURES   ____________________________________________   INITIAL IMPRESSION / ASSESSMENT AND PLAN / ED COURSE  Pertinent labs & imaging results that were available during my care of the patient were reviewed by me and considered in my medical decision making (see chart for details).  Patient presents with abdominal pain which is chronic in nature and related to  abdominal hernia. Notably a check the controlled substance reporting system for the state of [place] which found that the patient is chronically on Percocet. He last filled and opioid prescription for 90 tablets about 5 weeks ago. I encouraged him to follow up with his doctor in [place] for continued prescribing of his chronic pain medication. I also encouraged him to follow up with Dr. Katrinka Blazing for reevaluation in the next few days. Give him a very short-term prescription for Percocet. No evidence of obstruction, AAA, surgical pathology or infection. He is overall well appearing and just wants more Percocet.     ____________________________________________   FINAL CLINICAL IMPRESSION(S) / ED DIAGNOSES  Final diagnoses:  Ventral hernia, recurrence not specified  chronic abdominal pain    Sharman Cheek, MD 02/28/15 1537

## 2015-05-24 ENCOUNTER — Encounter: Payer: Self-pay | Admitting: Emergency Medicine

## 2015-05-24 ENCOUNTER — Emergency Department
Admission: EM | Admit: 2015-05-24 | Discharge: 2015-05-24 | Discharge status: Against Medical Advice | Payer: Medicaid Other | Attending: Emergency Medicine | Admitting: Emergency Medicine

## 2015-05-24 ENCOUNTER — Emergency Department: Payer: Medicaid Other

## 2015-05-24 DIAGNOSIS — F17210 Nicotine dependence, cigarettes, uncomplicated: Secondary | ICD-10-CM | POA: Diagnosis not present

## 2015-05-24 DIAGNOSIS — J45901 Unspecified asthma with (acute) exacerbation: Secondary | ICD-10-CM | POA: Insufficient documentation

## 2015-05-24 DIAGNOSIS — A419 Sepsis, unspecified organism: Secondary | ICD-10-CM | POA: Diagnosis not present

## 2015-05-24 DIAGNOSIS — R000 Tachycardia, unspecified: Secondary | ICD-10-CM | POA: Insufficient documentation

## 2015-05-24 DIAGNOSIS — Z79899 Other long term (current) drug therapy: Secondary | ICD-10-CM | POA: Diagnosis not present

## 2015-05-24 DIAGNOSIS — Z7984 Long term (current) use of oral hypoglycemic drugs: Secondary | ICD-10-CM | POA: Diagnosis not present

## 2015-05-24 DIAGNOSIS — J189 Pneumonia, unspecified organism: Secondary | ICD-10-CM

## 2015-05-24 DIAGNOSIS — E119 Type 2 diabetes mellitus without complications: Secondary | ICD-10-CM | POA: Diagnosis not present

## 2015-05-24 DIAGNOSIS — J159 Unspecified bacterial pneumonia: Secondary | ICD-10-CM | POA: Diagnosis not present

## 2015-05-24 DIAGNOSIS — I10 Essential (primary) hypertension: Secondary | ICD-10-CM | POA: Insufficient documentation

## 2015-05-24 DIAGNOSIS — R0602 Shortness of breath: Secondary | ICD-10-CM | POA: Diagnosis present

## 2015-05-24 LAB — CBC
HCT: 43.4 % (ref 40.0–52.0)
Hemoglobin: 15 g/dL (ref 13.0–18.0)
MCH: 32.4 pg (ref 26.0–34.0)
MCHC: 34.5 g/dL (ref 32.0–36.0)
MCV: 94 fL (ref 80.0–100.0)
PLATELETS: 139 10*3/uL — AB (ref 150–440)
RBC: 4.62 MIL/uL (ref 4.40–5.90)
RDW: 12.8 % (ref 11.5–14.5)
WBC: 9.9 10*3/uL (ref 3.8–10.6)

## 2015-05-24 LAB — BRAIN NATRIURETIC PEPTIDE: B Natriuretic Peptide: 337 pg/mL — ABNORMAL HIGH (ref 0.0–100.0)

## 2015-05-24 LAB — BLOOD GAS, VENOUS
Acid-Base Excess: 0.3 mmol/L (ref 0.0–3.0)
Bicarbonate: 25.4 mEq/L (ref 21.0–28.0)
PATIENT TEMPERATURE: 37
PCO2 VEN: 42 mmHg — AB (ref 44.0–60.0)
pH, Ven: 7.39 (ref 7.320–7.430)
pO2, Ven: <31 mmHg (ref 30.0–45.0)

## 2015-05-24 LAB — BASIC METABOLIC PANEL
Anion gap: 8 (ref 5–15)
BUN: 9 mg/dL (ref 6–20)
CALCIUM: 8.7 mg/dL — AB (ref 8.9–10.3)
CO2: 24 mmol/L (ref 22–32)
CREATININE: 0.59 mg/dL — AB (ref 0.61–1.24)
Chloride: 107 mmol/L (ref 101–111)
GFR calc non Af Amer: >60 mL/min (ref >60)
Glucose, Bld: 215 mg/dL — ABNORMAL HIGH (ref 65–99)
Potassium: 3.7 mmol/L (ref 3.5–5.1)
SODIUM: 139 mmol/L (ref 135–145)

## 2015-05-24 LAB — TROPONIN I

## 2015-05-24 MED ORDER — LEVOFLOXACIN 750 MG PO TABS: 750.0000 mg | ORAL_TABLET | Freq: Every day | ORAL | Status: AC

## 2015-05-24 MED ORDER — LORAZEPAM 2 MG/ML IJ SOLN
INTRAMUSCULAR | Status: AC
  Administered 2015-05-24: 1 mg via INTRAVENOUS
  Filled 2015-05-24: qty 1

## 2015-05-24 MED ORDER — OXYCODONE-ACETAMINOPHEN 5-325 MG PO TABS
2.0000 | ORAL_TABLET | Freq: Once | ORAL | Status: AC
  Administered 2015-05-24: 2 via ORAL
  Filled 2015-05-24: qty 2

## 2015-05-24 MED ORDER — SODIUM CHLORIDE 0.9 % IV BOLUS (SEPSIS)
1000.0000 mL | Freq: Once | INTRAVENOUS | Status: AC
  Administered 2015-05-24: 1000 mL via INTRAVENOUS

## 2015-05-24 MED ORDER — LORAZEPAM 2 MG/ML IJ SOLN
1.0000 mg | Freq: Once | INTRAMUSCULAR | Status: AC
  Administered 2015-05-24: 1 mg via INTRAVENOUS

## 2015-05-24 MED ORDER — DEXTROSE 5 % IV SOLN
1.0000 g | Freq: Once | INTRAVENOUS | Status: AC
  Administered 2015-05-24: 1 g via INTRAVENOUS
  Filled 2015-05-24: qty 10

## 2015-05-24 MED ORDER — DEXTROSE 5 % IV SOLN
500.0000 mg | Freq: Once | INTRAVENOUS | Status: AC
  Administered 2015-05-24: 500 mg via INTRAVENOUS
  Filled 2015-05-24: qty 500

## 2015-05-24 NOTE — ED Notes (Signed)
Pt refused to be admitted, states he has a sick family member at home that he needs to take care of. Pt informed that this put him at a very high risk for blood infection (sepsis) if he was not septic already. He states he understand but he has to go home. Pt informed to come back anytime if he feels worse. Pt verbalize understand.

## 2015-05-24 NOTE — Discharge Instructions (Signed)
Please make an appointment to be reevaluated by your primary care physician tomorrow.  Please return to the emergency department if you develop worsening shortness of breath, chest pain, lightheadedness or fainting, fever, or any other symptoms concerning to you.  You are leaving AGAINST MEDICAL ADVICE with a very serious infection in your lungs. Leaving before your treatment is complete lead to permanent lung damage, heart damage, and worsening infection that can lead to death. Please do not hesitate to return to the emergency department for any worsening of your symptoms or if you change your mind and would like to be admitted for appropriate treatment.

## 2015-05-24 NOTE — ED Provider Notes (Signed)
[facility] Emergency Department Provider Note  ____________________________________________  Time seen: Approximately 12:15 PM  I have reviewed the triage vital signs and the nursing notes.   HISTORY  Chief Complaint Shortness of Breath and Chest Pain    HPI Michael Santana is a 54 y.o. male and a history of HTN, DM, asthma, ongoing tobacco abuse but no known cardiac history presenting with shortness of breath and chest pressure. Patient statesthat since yesterday he has had a progressively worsening shortness of breath. Worse with exertion and associated with significant decreased in exercise tolerance. Shortness of breath is worse with laying flat. Since yesterday he has also had a central chest "pushing" feeling. "I feel like he needs to cough but can't get anything up." No fevers, chills, sore throat, rhinorrhea or congestion, ear pain, palpitations. No lower extremity swelling, calf pain.  SH: Ongoing tobacco abuse. Negative cocaine use.   Past Medical History  Diagnosis Date  . Asthma   . Diabetes mellitus without complication (HCC)   . Hypertension     There are no active problems to display for this patient.   Past Surgical History  Procedure Laterality Date  . Hernia repair      Current Outpatient Rx  Name  Route  Sig  Dispense  Refill  . escitalopram (LEXAPRO) 10 MG tablet   Oral   Take 10 mg by mouth daily.         Marland Kitchen glipiZIDE (GLUCOTROL) 10 MG tablet   Oral   Take 10 mg by mouth 2 (two) times daily.         Marland Kitchen levofloxacin (LEVAQUIN) 750 MG tablet   Oral   Take 1 tablet (750 mg total) by mouth daily.   7 tablet   0   . metFORMIN (GLUCOPHAGE-XR) 500 MG 24 hr tablet   Oral   Take 1,000 mg by mouth 2 (two) times daily.         Marland Kitchen oxyCODONE-acetaminophen (PERCOCET) 10-325 MG per tablet   Oral   Take 1 tablet by mouth 3 (three) times daily.         Marland Kitchen oxyCODONE-acetaminophen (ROXICET) 5-325 MG per tablet   Oral   Take 1  tablet by mouth every 6 (six) hours as needed for severe pain.   8 tablet   0     Allergies Tramadol  No family history on file.  Social History Social History  Substance Use Topics  . Smoking status: Current Every Day Smoker    Types: Cigarettes  . Smokeless tobacco: None  . Alcohol Use: No    Review of Systems Constitutional: No fever/chills. Positive lightheadedness with exertion. Negative syncope. Eyes: No visual changes. ENT: No sore throat. No rhinorrhea. No ear pain. Cardiovascular: Positive chest pain, negative palpitations. Respiratory: Positive shortness of breath.  No cough. Gastrointestinal: No abdominal pain.  No nausea, no vomiting.  No diarrhea.  No constipation. Genitourinary: Negative for dysuria. Musculoskeletal: Negative for back pain. Skin: Negative for rash. Neurological: Negative for headaches, focal weakness or numbness.  10-point ROS otherwise negative.  ____________________________________________   PHYSICAL EXAM:  VITAL SIGNS: ED Triage Vitals  Enc Vitals Group     BP 05/24/15 1143 142/85 mmHg     Pulse Rate 05/24/15 1143 123     Resp 05/24/15 1143 26     Temp 05/24/15 1143 98.3 F (36.8 C)     Temp Source 05/24/15 1143 Oral     SpO2 05/24/15 1143 95 %  Weight 05/24/15 1143 250 lb (113.399 kg)     Height 05/24/15 1143  (1.854 m)     Head Cir --      Peak Flow --      Pain Score 05/24/15 1146 7     Pain Loc --      Pain Edu? --      Excl. in GC? --     Constitutional: Patient is alert and oriented and able to answer questions appropriately. He is uncomfortable appearing and tachypnea. He is able to speak in 3-4 word sentences.  Eyes: Conjunctivae are normal.  EOMI. no scleral icterus. Head: Atraumatic. Nose: No congestion/rhinnorhea. Mouth/Throat: Mucous membranes are moist.  Neck: No stridor.  Supple.  No JVD. Cardiovascular: Fast rate, regular rhythm. No murmurs, rubs or gallops.  Respiratory: Tachypnea with  accessory muscle use and minimal retractions. Lungs are clear to auscultation bilaterally without wheezes rales or rhonchi. Gastrointestinal: Soft and nontender. No distention. No peritoneal signs. Central easily reducible umbilical hernia. Musculoskeletal: Minimal nonpitting lower extremity edema around the ankles. No calf tenderness to palpation, palpable cords. Negative Homans sign. Neurologic:  Normal speech and language. No gross focal neurologic deficits are appreciated.  Skin:  Skin is warm, dry and intact. No rash noted. Psychiatric: Mood and affect are normal. Speech and behavior are normal.  Normal judgement.  ____________________________________________   LABS (all labs ordered are listed, but only abnormal results are displayed)  Labs Reviewed  BASIC METABOLIC PANEL - Abnormal; Notable for the following:    Glucose, Bld 215 (*)    Creatinine, Ser 0.59 (*)    Calcium 8.7 (*)    All other components within normal limits  CBC - Abnormal; Notable for the following:    Platelets 139 (*)    All other components within normal limits  BRAIN NATRIURETIC PEPTIDE - Abnormal; Notable for the following:    B Natriuretic Peptide 337.0 (*)    All other components within normal limits  BLOOD GAS, VENOUS - Abnormal; Notable for the following:    pCO2, Ven 42 (*)    All other components within normal limits  CULTURE, BLOOD (ROUTINE X 2)  CULTURE, BLOOD (ROUTINE X 2)  TROPONIN I   ____________________________________________  EKG  ED ECG REPORT I, Rockne Menghini, the attending physician, personally viewed and interpreted this ECG.   Date: 05/24/2015  EKG Time: 1143  Rate: 122  Rhythm: sinus tachycardia  Axis: Normal  Intervals:none  ST&T Change: No ST elevation.  ____________________________________________  RADIOLOGY  Dg Chest 2 View  05/24/2015  CLINICAL DATA:  54 year old male with increasing shortness of breath, chest pain, headache, dizziness, congestion.  Initial encounter. Smoker. EXAM: CHEST  2 VIEW COMPARISON:  08/17/2013 and earlier. FINDINGS: Bilateral but right greater than left increased reticulonodular opacity, perihilar and extending peripherally into the lung. No associated pleural effusion or consolidation. No pneumothorax. Mild cardiomegaly is stable. Other mediastinal contours are within normal limits. Visualized tracheal air column is within normal limits. Osteopenia. No acute osseous abnormality identified. IMPRESSION: Acute right greater than left reticulonodular pulmonary opacity most compatible with acute viral/atypical pneumonia. No pleural effusion. Electronically Signed   By: Odessa Fleming M.D.   On: 05/24/2015 12:48    ____________________________________________   PROCEDURES  Procedure(s) performed: None  Critical Care performed: Yes ____________________________________________   INITIAL IMPRESSION / ASSESSMENT AND PLAN / ED COURSE  Pertinent labs & imaging results that were available during my care of the patient were reviewed by me and considered in  my medical decision making (see chart for details).  54 y.o. male with 2 days of progressively worsening dyspnea on exertion, orthopnea, with associated chest pressure. I am concerned about the patient having a new onset CHF flare, consider asthma or COPD although the patient is not having significant wheezing or cough component. Rule out pneumonia but again less likely without significant cough or fever. Consider ACS or MI. PE is less likely but I will consider this as well.  ----------------------------------------- 1:17 PM on 05/24/2015 -----------------------------------------  The patient has chest x-ray which shows a right greater than left pulmonary opacities that are consistent with an ammonia. I still feel that congestive heart failure may be a cause but I'm awaiting the results of the BNP. Troponin is negative. I will initiate empiric antibiotics and await final  results for admission to the hospital.  ----------------------------------------- 2:09 PM on 05/24/2015 -----------------------------------------  The patient's tachypnea has mildly improved and he is maintaining oxygen saturations of 94% on room air. He is hypercarbic which is likely due to his hyperventilation. His BNP is only 337, so proceed with giving him IV fluid for his pneumonia. His heart rate at this time continues to be in the 120s. He is also mildly hypertensive so he may benefit from a beta blocker but I will not initiate this until after I have given him hydration because he may be dependent on his tachycardia for his significant infection. I have asked the patient to stay at the hospital for admission but at this time he is refusing. He understands that if he goes home that he will potentially worsen and could die. He understands that I would like to keep him at the hospital for IV antibiotics and continued pulmonary evaluation. He still does not want to stay. He was stay for a liter fluid and reevaluation.  CRITICAL CARE Performed by: Rockne MenghiniNorman, Anne-Caroline   Total critical care time: 45 minutes  Critical care time was exclusive of separately billable procedures and treating other patients.  Critical care was necessary to treat or prevent imminent or life-threatening deterioration.  Critical care was time spent personally by me on the following activities: development of treatment plan with patient and/or surrogate as well as nursing, discussions with consultants, evaluation of patient's response to treatment, examination of patient, obtaining history from patient or surrogate, ordering and performing treatments and interventions, ordering and review of laboratory studies, ordering and review of radiographic studies, pulse oximetry and re-evaluation of patient's condition.   ____________________________________________  FINAL CLINICAL IMPRESSION(S) / ED DIAGNOSES  Final  diagnoses:  Community acquired pneumonia  Sepsis, due to unspecified organism (HCC)  Sinus tachycardia (HCC)      NEW MEDICATIONS STARTED DURING THIS VISIT:  New Prescriptions   LEVOFLOXACIN (LEVAQUIN) 750 MG TABLET    Take 1 tablet (750 mg total) by mouth daily.     Rockne MenghiniAnne-Caroline Loleta Frommelt, MD 05/24/15 1511

## 2015-05-24 NOTE — ED Notes (Signed)
Pt comes into the ED via EMS from home c/o shortness of breath and chest pain that started last night.  Patient has h/o asthma.  Patient at 97% room air upon EMS arrival but dropped to 94% with exertion.  Patient placed on 2L nasal cannula at brought back up to 98%.  Patient has undergone recent surgery for ventral hernias x2. Lungs clear bilaterally.

## 2015-05-29 LAB — CULTURE, BLOOD (ROUTINE X 2)
CULTURE: NO GROWTH
Culture: NO GROWTH

## 2015-07-10 ENCOUNTER — Other Ambulatory Visit: Payer: Self-pay

## 2015-07-10 ENCOUNTER — Emergency Department: Payer: Medicaid Other

## 2015-07-10 ENCOUNTER — Encounter: Payer: Self-pay | Admitting: Emergency Medicine

## 2015-07-10 ENCOUNTER — Emergency Department
Admission: EM | Admit: 2015-07-10 | Discharge: 2015-07-10 | Disposition: A | Discharge status: Home / Self Care | Payer: Medicaid Other | Attending: Emergency Medicine | Admitting: Emergency Medicine

## 2015-07-10 DIAGNOSIS — E119 Type 2 diabetes mellitus without complications: Secondary | ICD-10-CM | POA: Diagnosis not present

## 2015-07-10 DIAGNOSIS — J45901 Unspecified asthma with (acute) exacerbation: Secondary | ICD-10-CM | POA: Diagnosis not present

## 2015-07-10 DIAGNOSIS — I10 Essential (primary) hypertension: Secondary | ICD-10-CM | POA: Diagnosis not present

## 2015-07-10 DIAGNOSIS — R05 Cough: Secondary | ICD-10-CM | POA: Diagnosis present

## 2015-07-10 DIAGNOSIS — F17210 Nicotine dependence, cigarettes, uncomplicated: Secondary | ICD-10-CM | POA: Diagnosis not present

## 2015-07-10 NOTE — ED Notes (Addendum)
Pt to ed via ems with c/o cough, congestion, sob, since being diagnosed with pneumonia.  Pt states he finished abx that were given to him but symptoms remain.  Pt denies chest pain.  Pt sats wnl, 96% at triage.

## 2015-07-11 ENCOUNTER — Telehealth: Payer: Self-pay | Admitting: Emergency Medicine
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
## 2015-10-15 ENCOUNTER — Emergency Department
Admission: EM | Admit: 2015-10-15 | Discharge: 2015-10-15 | Disposition: A | Discharge status: Home / Self Care | Payer: Medicaid Other | Attending: Student | Admitting: Student

## 2015-10-15 ENCOUNTER — Emergency Department: Payer: Medicaid Other

## 2015-10-15 DIAGNOSIS — I10 Essential (primary) hypertension: Secondary | ICD-10-CM | POA: Insufficient documentation

## 2015-10-15 DIAGNOSIS — F17210 Nicotine dependence, cigarettes, uncomplicated: Secondary | ICD-10-CM | POA: Diagnosis not present

## 2015-10-15 DIAGNOSIS — R109 Unspecified abdominal pain: Secondary | ICD-10-CM

## 2015-10-15 DIAGNOSIS — N200 Calculus of kidney: Secondary | ICD-10-CM | POA: Diagnosis not present

## 2015-10-15 DIAGNOSIS — J45909 Unspecified asthma, uncomplicated: Secondary | ICD-10-CM | POA: Insufficient documentation

## 2015-10-15 DIAGNOSIS — Z7984 Long term (current) use of oral hypoglycemic drugs: Secondary | ICD-10-CM | POA: Diagnosis not present

## 2015-10-15 DIAGNOSIS — E119 Type 2 diabetes mellitus without complications: Secondary | ICD-10-CM | POA: Insufficient documentation

## 2015-10-15 DIAGNOSIS — M549 Dorsalgia, unspecified: Secondary | ICD-10-CM | POA: Diagnosis present

## 2015-10-15 DIAGNOSIS — R319 Hematuria, unspecified: Secondary | ICD-10-CM | POA: Diagnosis not present

## 2015-10-15 LAB — URINALYSIS COMPLETE WITH MICROSCOPIC (ARMC ONLY)
BILIRUBIN URINE: NEGATIVE
Bacteria, UA: NONE SEEN
GLUCOSE, UA: NEGATIVE mg/dL
KETONES UR: NEGATIVE mg/dL
LEUKOCYTES UA: NEGATIVE
NITRITE: NEGATIVE
Protein, ur: 30 mg/dL — AB
Specific Gravity, Urine: 1.024 (ref 1.005–1.030)
pH: 5 (ref 5.0–8.0)

## 2015-10-15 MED ORDER — DIAZEPAM 5 MG PO TABS
5.0000 mg | ORAL_TABLET | Freq: Once | ORAL | Status: AC
  Administered 2015-10-15: 5 mg via ORAL
  Filled 2015-10-15: qty 1

## 2015-10-15 MED ORDER — DIAZEPAM 2 MG PO TABS: 2.0000 mg | ORAL_TABLET | Freq: Three times a day (TID) | ORAL | Status: AC | PRN

## 2015-10-15 MED ORDER — OXYCODONE-ACETAMINOPHEN 5-325 MG PO TABS
1.0000 | ORAL_TABLET | Freq: Once | ORAL | Status: AC
  Administered 2015-10-15: 1 via ORAL
  Filled 2015-10-15: qty 1

## 2015-10-15 MED ORDER — KETOROLAC TROMETHAMINE 60 MG/2ML IM SOLN
30.0000 mg | Freq: Once | INTRAMUSCULAR | Status: DC
  Filled 2015-10-15: qty 2

## 2015-10-15 MED ORDER — KETOROLAC TROMETHAMINE 30 MG/ML IJ SOLN
INTRAMUSCULAR | Status: AC
  Administered 2015-10-15: 30 mg
  Filled 2015-10-15: qty 1

## 2015-10-15 MED ORDER — OXYCODONE-ACETAMINOPHEN 5-325 MG PO TABS: 1.0000 | ORAL_TABLET | ORAL | Status: AC | PRN
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
## 2015-10-15 NOTE — ED Notes (Signed)
C/o right side back pain onset 6 days ago.  Denies injury,    Pt reports that he always has a cough and SOB but that is no worse

## 2015-10-15 NOTE — ED Notes (Signed)
Low back pain, denies any injury.  Ambulatory without difficulty.  Painful to bend.  Denies any dysuria.
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
## 2016-01-24 ENCOUNTER — Emergency Department: Payer: Medicaid Other

## 2016-01-24 ENCOUNTER — Encounter: Payer: Self-pay | Admitting: *Deleted

## 2016-01-24 ENCOUNTER — Emergency Department
Admission: EM | Admit: 2016-01-24 | Discharge: 2016-01-24 | Disposition: A | Discharge status: Home / Self Care | Payer: Medicaid Other | Attending: Emergency Medicine | Admitting: Emergency Medicine

## 2016-01-24 DIAGNOSIS — E119 Type 2 diabetes mellitus without complications: Secondary | ICD-10-CM | POA: Diagnosis not present

## 2016-01-24 DIAGNOSIS — I10 Essential (primary) hypertension: Secondary | ICD-10-CM | POA: Insufficient documentation

## 2016-01-24 DIAGNOSIS — R531 Weakness: Secondary | ICD-10-CM | POA: Diagnosis present

## 2016-01-24 DIAGNOSIS — F17210 Nicotine dependence, cigarettes, uncomplicated: Secondary | ICD-10-CM | POA: Diagnosis not present

## 2016-01-24 DIAGNOSIS — Z7984 Long term (current) use of oral hypoglycemic drugs: Secondary | ICD-10-CM | POA: Insufficient documentation

## 2016-01-24 DIAGNOSIS — R6889 Other general symptoms and signs: Secondary | ICD-10-CM

## 2016-01-24 DIAGNOSIS — J45909 Unspecified asthma, uncomplicated: Secondary | ICD-10-CM | POA: Insufficient documentation

## 2016-01-24 DIAGNOSIS — F1123 Opioid dependence with withdrawal: Secondary | ICD-10-CM | POA: Diagnosis not present

## 2016-01-24 LAB — DIFFERENTIAL
BASOS ABS: 0 10*3/uL (ref 0–0.1)
BASOS PCT: 1 %
Eosinophils Absolute: 0.1 10*3/uL (ref 0–0.7)
Eosinophils Relative: 1 %
Lymphocytes Relative: 23 %
Lymphs Abs: 1.9 10*3/uL (ref 1.0–3.6)
MONO ABS: 0.6 10*3/uL (ref 0.2–1.0)
Monocytes Relative: 8 %
NEUTROS ABS: 5.6 10*3/uL (ref 1.4–6.5)
Neutrophils Relative %: 67 %

## 2016-01-24 LAB — BASIC METABOLIC PANEL
Anion gap: 8 (ref 5–15)
BUN: 10 mg/dL (ref 6–20)
CHLORIDE: 105 mmol/L (ref 101–111)
CO2: 26 mmol/L (ref 22–32)
CREATININE: 0.82 mg/dL (ref 0.61–1.24)
Calcium: 8.9 mg/dL (ref 8.9–10.3)
Glucose, Bld: 109 mg/dL — ABNORMAL HIGH (ref 65–99)
POTASSIUM: 3.7 mmol/L (ref 3.5–5.1)
SODIUM: 139 mmol/L (ref 135–145)

## 2016-01-24 LAB — URINE DRUG SCREEN, QUALITATIVE (ARMC ONLY)
Amphetamines, Ur Screen: NOT DETECTED
BARBITURATES, UR SCREEN: NOT DETECTED
BENZODIAZEPINE, UR SCRN: POSITIVE — AB
Cannabinoid 50 Ng, Ur ~~LOC~~: NOT DETECTED
Cocaine Metabolite,Ur ~~LOC~~: NOT DETECTED
MDMA (Ecstasy)Ur Screen: NOT DETECTED
METHADONE SCREEN, URINE: NOT DETECTED
Opiate, Ur Screen: NOT DETECTED
Phencyclidine (PCP) Ur S: NOT DETECTED
TRICYCLIC, UR SCREEN: NOT DETECTED

## 2016-01-24 LAB — LACTIC ACID, PLASMA: Lactic Acid, Venous: 1 mmol/L (ref 0.5–1.9)

## 2016-01-24 LAB — URINALYSIS COMPLETE WITH MICROSCOPIC (ARMC ONLY)
BILIRUBIN URINE: NEGATIVE
Bacteria, UA: NONE SEEN
Glucose, UA: NEGATIVE mg/dL
KETONES UR: NEGATIVE mg/dL
LEUKOCYTES UA: NEGATIVE
NITRITE: NEGATIVE
PH: 6 (ref 5.0–8.0)
PROTEIN: 30 mg/dL — AB
SPECIFIC GRAVITY, URINE: 1.02 (ref 1.005–1.030)

## 2016-01-24 LAB — HEPATIC FUNCTION PANEL
ALT: 18 U/L (ref 17–63)
AST: 28 U/L (ref 15–41)
Albumin: 4.4 g/dL (ref 3.5–5.0)
Alkaline Phosphatase: 66 U/L (ref 38–126)
BILIRUBIN INDIRECT: 1.2 mg/dL — AB (ref 0.3–0.9)
Bilirubin, Direct: 0.2 mg/dL (ref 0.1–0.5)
TOTAL PROTEIN: 7.8 g/dL (ref 6.5–8.1)
Total Bilirubin: 1.4 mg/dL — ABNORMAL HIGH (ref 0.3–1.2)

## 2016-01-24 LAB — CBC
HCT: 45.2 % (ref 40.0–52.0)
Hemoglobin: 15.6 g/dL (ref 13.0–18.0)
MCH: 32.3 pg (ref 26.0–34.0)
MCHC: 34.5 g/dL (ref 32.0–36.0)
MCV: 93.6 fL (ref 80.0–100.0)
PLATELETS: 143 10*3/uL — AB (ref 150–440)
RBC: 4.82 MIL/uL (ref 4.40–5.90)
RDW: 15.4 % — AB (ref 11.5–14.5)
WBC: 8.2 10*3/uL (ref 3.8–10.6)

## 2016-01-24 LAB — LIPASE, BLOOD: Lipase: 25 U/L (ref 11–51)

## 2016-01-24 LAB — TROPONIN I

## 2016-01-24 LAB — TSH: TSH: 0.886 u[IU]/mL (ref 0.350–4.500)

## 2016-01-24 NOTE — Discharge Instructions (Signed)
You are cleared to go back to the treatment center. The blood work which has returned so far is normal. If anything else turns up in the next hour we will call you. You do have some blood in your urine. This was present before. You should go to the urologist and get this checked out.

## 2016-01-24 NOTE — ED Notes (Signed)
Pts contact information:  [phone]

## 2016-01-24 NOTE — ED Triage Notes (Signed)
Pt arrived to ED from [facility] after staff there reported that pt looked as though he needed to be checked out for a stroke. PT reports he has been weak for the past 4 days. Pt denies dizziness or syncopal episodes. Pt denies having numbness, slurred speech or other neuro deficits. Pt reports he has had decrease oral intake over the past 4 days with on and off NVD due to withdrawal for opioids. Pt reports [facility] would not take patient until he was cleared as not having a stroke.

## 2016-01-24 NOTE — ED Notes (Signed)
See triage note...patient sts that he has been weak x 1 week. Sts that he is able to move around house, able to get out of bed but "doesn't want" to.  Pt sts he has been able to eat/drink, but has lack of interest in food.  Pt sts he last used pain medications approx 3 days ago. Pt able to ambulate to room w/o issue, denies CP, SOB, n/v/d or LOC.  NAD.

## 2016-01-24 NOTE — ED Notes (Signed)
Spoke w/ physician at [facility], she stated that did not tell pt that he was in danger of having a stroke, or observe stroke S/S.  She stated that she sent pt to ED b/c he was having withdrawal s/s, he has a significant medical history and his age would put him in danger of having a seizure and she wanted him medically cleared.  [facility].

## 2016-01-24 NOTE — ED Notes (Signed)
MD at bedside. 

## 2016-01-24 NOTE — ED Provider Notes (Signed)
[facility] Emergency Department Provider Note   ____________________________________________   First MD Initiated Contact with Patient 01/24/16 1545     (approximate)  I have reviewed the triage vital signs and the nursing notes.   HISTORY  Chief Complaint Weakness    HPI Michael Santana is a 55 y.o. male patien he's been trying to get off the Percocet himself but is now very anxious to leave here to get to the clinic before they closed. Patient has no focal weakness no slurry speech no signs of stroke at all. t reports she's been taking 10-12 Percocets a day. He's been trying to get into a clinic to help him get off the drugs for several years. Today he went to see the pharmacist pharmacist got him in the clinic right away. However the clinic sent him here apparently because they were afraid he might have a stroke. Patient reports he's been 3 days without his Percocet. He is nauseated he's not one D much and he vomits when he does eat. He is not having any abdominal pain. He is somewhat tachycardic he feels a little bit weak. He has goosebumps and his nose is running.   Past Medical History:  Diagnosis Date  . Asthma   . Diabetes mellitus without complication (HCC)   . Hypertension     There are no active problems to display for this patient.   Past Surgical History:  Procedure Laterality Date  . HERNIA REPAIR      Prior to Admission medications   Medication Sig Start Date End Date Taking? Authorizing Provider  diazepam (VALIUM) 2 MG tablet Take 1 tablet (2 mg total) by mouth every 8 (eight) hours as needed for muscle spasms. 10/15/15   Tommi Rumpshonda L Summers, PA-C  escitalopram (LEXAPRO) 10 MG tablet Take 10 mg by mouth daily.    Historical Provider, MD  glipiZIDE (GLUCOTROL) 10 MG tablet Take 10 mg by mouth 2 (two) times daily.    Historical Provider, MD  metFORMIN (GLUCOPHAGE-XR) 500 MG 24 hr tablet Take 1,000 mg by mouth 2 (two) times daily.     Historical Provider, MD  oxyCODONE-acetaminophen (PERCOCET) 5-325 MG tablet Take 1 tablet by mouth every 4 (four) hours as needed for severe pain. 10/15/15   Tommi Rumpshonda L Summers, PA-C    Allergies Tramadol  History reviewed. No pertinent family history.  Social History Social History  Substance Use Topics  . Smoking status: Current Every Day Smoker    Packs/day: 1.00    Years: 47.00    Types: Cigarettes  . Smokeless tobacco: Never Used  . Alcohol use No    Review of Systems Constitutional: No fever/chills Eyes: No visual changes. ENT: No sore throat. Cardiovascular: Denies chest pain. Respiratory: Denies shortness of breath. Gastrointestinal: No abdominal pain.  No nausea, no vomiting.  No diarrhea.  No constipation. Genitourinary: Negative for dysuria. Musculoskeletal: Negative for back pain. Skin: Negative for rash. Neurological: Negative for headaches, focal weakness or numbness.  10-point ROS otherwise negative.  ____________________________________________   PHYSICAL EXAM:  VITAL SIGNS: ED Triage Vitals  Enc Vitals Group     BP 01/24/16 1257 130/90     Pulse Rate 01/24/16 1257 91     Resp 01/24/16 1257 18     Temp 01/24/16 1257 98 F (36.7 C)     Temp Source 01/24/16 1257 Oral     SpO2 01/24/16 1257 97 %     Weight 01/24/16 1257 230 lb (104.3 kg)  Height 01/24/16 1257 6\' 1"  (1.854 m)     Head Circumference --      Peak Flow --      Pain Score 01/24/16 1306 10     Pain Loc --      Pain Edu? --      Excl. in GC? --     Constitutional: Alert and oriented. Well appearing and in no acute distress. Eyes: Conjunctivae are normal. PERRL. EOMI. Head: Atraumatic. Nose: No congestion/rhinnorhea. Mouth/Throat: Mucous membranes are moist.  Oropharynx non-erythematous. Neck: No stridor.   Cardiovascular: Slightly tachycardic regular rhythm. Grossly normal heart sounds.  Good peripheral circulation. Respiratory: Normal respiratory effort.  No retractions. Lungs  CTAB. Gastrointestinal: Soft and nontender. No distention. No abdominal bruits. No CVA tenderness. Musculoskeletal: No lower extremity tenderness nor edema.  No joint effusions. Neurologic:  Normal speech and language. No gross focal neurologic deficits are appreciated. No gait instability. Skin:  Skin is warm, dry and intact. No rash noted. Psychiatric: Mood and affect are normal. Speech and behavior are normal.  ____________________________________________   LABS (all labs ordered are listed, but only abnormal results are displayed)  Labs Reviewed  BASIC METABOLIC PANEL - Abnormal; Notable for the following:       Result Value   Glucose, Bld 109 (*)    All other components within normal limits  CBC - Abnormal; Notable for the following:    RDW 15.4 (*)    Platelets 143 (*)    All other components within normal limits  URINALYSIS COMPLETEWITH MICROSCOPIC ([facility] ONLY) - Abnormal; Notable for the following:    Color, Urine YELLOW (*)    APPearance CLEAR (*)    Hgb urine dipstick 1+ (*)    Protein, ur 30 (*)    Squamous Epithelial / LPF 0-5 (*)    All other components within normal limits  LACTIC ACID, PLASMA  LACTIC ACID, PLASMA  CBC WITH DIFFERENTIAL/PLATELET  URINE DRUG SCREEN, QUALITATIVE ([facility] ONLY)  DIFFERENTIAL  HEPATIC FUNCTION PANEL  LIPASE, BLOOD  TROPONIN I  TSH  CBG MONITORING, ED   ____________________________________________  EKG  EKG read and interpreted by me shows normal sinus rhythm rate of 82 normal axis no acute abnormalities ____________________________________________  RADIOLOGY  ____________________________________________   PROCEDURES  Procedure(s) performed:   Procedures  Critical Care performed:   ____________________________________________   INITIAL IMPRESSION / ASSESSMENT AND PLAN / ED COURSE  Pertinent labs & imaging results that were available during my care of the patient were reviewed by me and considered in my medical  decision making (see chart for details).    Clinical Course     ____________________________________________   FINAL CLINICAL IMPRESSION(S) / ED DIAGNOSES  Final diagnoses:  Withdrawal complaint      NEW MEDICATIONS STARTED DURING THIS VISIT:  New Prescriptions   No medications on file     Note:  This document was prepared using Dragon voice recognition software and may include unintentional dictation errors.    Arnaldo NatalPaul F Arkeem Harts, MD 01/24/16 [phone]

## 2016-01-24 NOTE — ED Notes (Signed)
Pt being D/C'd by MD Darnelle CatalanMalinda, to be called if any blood tests return abnormal .

## 2017-01-04 ENCOUNTER — Emergency Department: Payer: Medicaid Other

## 2017-01-04 ENCOUNTER — Emergency Department
Admission: EM | Admit: 2017-01-04 | Discharge: 2017-01-05 | Disposition: A | Discharge status: Home / Self Care | Payer: Medicaid Other | Attending: Emergency Medicine | Admitting: Emergency Medicine

## 2017-01-04 ENCOUNTER — Encounter: Payer: Self-pay | Admitting: Emergency Medicine

## 2017-01-04 DIAGNOSIS — J45909 Unspecified asthma, uncomplicated: Secondary | ICD-10-CM | POA: Diagnosis not present

## 2017-01-04 DIAGNOSIS — I10 Essential (primary) hypertension: Secondary | ICD-10-CM | POA: Insufficient documentation

## 2017-01-04 DIAGNOSIS — F17210 Nicotine dependence, cigarettes, uncomplicated: Secondary | ICD-10-CM | POA: Diagnosis not present

## 2017-01-04 DIAGNOSIS — E119 Type 2 diabetes mellitus without complications: Secondary | ICD-10-CM | POA: Diagnosis not present

## 2017-01-04 DIAGNOSIS — R079 Chest pain, unspecified: Secondary | ICD-10-CM

## 2017-01-04 DIAGNOSIS — M549 Dorsalgia, unspecified: Secondary | ICD-10-CM | POA: Diagnosis not present

## 2017-01-04 LAB — LIPASE, BLOOD: LIPASE: 24 U/L (ref 11–51)

## 2017-01-04 LAB — CBC
HCT: 46.5 % (ref 40.0–52.0)
Hemoglobin: 16.1 g/dL (ref 13.0–18.0)
MCH: 32.3 pg (ref 26.0–34.0)
MCHC: 34.8 g/dL (ref 32.0–36.0)
MCV: 93 fL (ref 80.0–100.0)
PLATELETS: 162 10*3/uL (ref 150–440)
RBC: 5 MIL/uL (ref 4.40–5.90)
RDW: 13.2 % (ref 11.5–14.5)
WBC: 8.3 10*3/uL (ref 3.8–10.6)

## 2017-01-04 LAB — TROPONIN I
Troponin I: <0.03 ng/mL (ref <0.03)
Troponin I: <0.03 ng/mL (ref <0.03)

## 2017-01-04 LAB — COMPREHENSIVE METABOLIC PANEL
ALBUMIN: 4.2 g/dL (ref 3.5–5.0)
ALK PHOS: 60 U/L (ref 38–126)
ALT: 57 U/L (ref 17–63)
ANION GAP: 9 (ref 5–15)
AST: 52 U/L — ABNORMAL HIGH (ref 15–41)
BUN: 12 mg/dL (ref 6–20)
CALCIUM: 9.2 mg/dL (ref 8.9–10.3)
CHLORIDE: 100 mmol/L — AB (ref 101–111)
CO2: 25 mmol/L (ref 22–32)
Creatinine, Ser: 0.89 mg/dL (ref 0.61–1.24)
GFR calc non Af Amer: >60 mL/min (ref >60)
GLUCOSE: 243 mg/dL — AB (ref 65–99)
POTASSIUM: 3.5 mmol/L (ref 3.5–5.1)
SODIUM: 134 mmol/L — AB (ref 135–145)
Total Bilirubin: 0.6 mg/dL (ref 0.3–1.2)
Total Protein: 7.4 g/dL (ref 6.5–8.1)

## 2017-01-04 MED ORDER — HYDROMORPHONE HCL 1 MG/ML IJ SOLN
0.5000 mg | Freq: Once | INTRAMUSCULAR | Status: AC
  Administered 2017-01-04: 0.5 mg via INTRAVENOUS
  Filled 2017-01-04: qty 1

## 2017-01-04 MED ORDER — DIAZEPAM 5 MG PO TABS: 5.0000 mg | ORAL_TABLET | Freq: Three times a day (TID) | ORAL | 0 refills | Status: AC | PRN

## 2017-01-04 MED ORDER — IOPAMIDOL (ISOVUE-370) INJECTION 76%
100.0000 mL | Freq: Once | INTRAVENOUS | Status: AC | PRN
  Administered 2017-01-04: 100 mL via INTRAVENOUS

## 2017-01-04 NOTE — ED Triage Notes (Addendum)
Pt c/o left sided chest pain that radiates to back.  [facility] because of his [facility]. Unlabored currently. Skin warm and dry. NAD. VSS.  Pain has been intermittent for 3 days.  Pt has had some abdominal pain in RUQ as well.

## 2017-01-04 NOTE — ED Provider Notes (Addendum)
[facility] Emergency Department Provider Note       Time seen: ----------------------------------------- 10:28 PM on 01/04/2017 -----------------------------------------     I have reviewed the triage vital signs and the nursing notes.   HISTORY   Chief Complaint Chest Pain    HPI Michael Santana is a 56 y.o. male who presents to the ED for left sided chest pain that radiates into the back. Patient has history of shortness of breath reports he is always has shortness of breath because of smoking. He has been intermittent, nothing makes it better or worse. He has had some upper abdominal pain as well. Patient states pain is sharp, 5 out of 10.   Past Medical History:  Diagnosis Date  . Asthma   . Diabetes mellitus without complication (HCC)   . Hypertension     There are no active problems to display for this patient.   Past Surgical History:  Procedure Laterality Date  . HERNIA REPAIR      Allergies Tramadol  Social History Social History  Substance Use Topics  . Smoking status: Current Every Day Smoker    Packs/day: 1.00    Years: 47.00    Types: Cigarettes  . Smokeless tobacco: Never Used  . Alcohol use No    Review of Systems Constitutional: Negative for fever. Eyes: Negative for vision changes ENT:  Negative for congestion, sore throat Cardiovascular: Positive for chest pain Respiratory: Positive shortness of breath Gastrointestinal: Negative for abdominal pain, vomiting and diarrhea. Genitourinary: Negative for dysuria. Musculoskeletal: Positive for back pain Skin: Negative for rash. Neurological: Negative for headaches, focal weakness or numbness.  All systems negative/normal/unremarkable except as stated in the HPI  ____________________________________________   PHYSICAL EXAM:  VITAL SIGNS: ED Triage Vitals [01/04/17 1754]  Enc Vitals Group     BP (!) 152/84     Pulse Rate 96     Resp 18     Temp 98.5 F  (36.9 C)     Temp Source Oral     SpO2 97 %     Weight 250 lb (113.4 kg)     Height 6\' 1"  (1.854 m)     Head Circumference      Peak Flow      Pain Score 5     Pain Loc      Pain Edu?      Excl. in GC?     Constitutional: Alert and oriented. Well appearing and in no distress. Eyes: Conjunctivae are normal. Normal extraocular movements. ENT   Head: Normocephalic and atraumatic.   Nose: No congestion/rhinnorhea.   Mouth/Throat: Mucous membranes are moist.   Neck: No stridor. Cardiovascular: Normal rate, regular rhythm. No murmurs, rubs, or gallops. Respiratory: Normal respiratory effort without tachypnea nor retractions. Breath sounds are clear and equal bilaterally. No wheezes/rales/rhonchi. Gastrointestinal: Soft and nontender. Normal bowel sounds Musculoskeletal: Nontender with normal range of motion in extremities. No lower extremity tenderness nor edema. Neurologic:  Normal speech and language. No gross focal neurologic deficits are appreciated.  Skin:  Skin is warm, dry and intact. No rash noted. Psychiatric: Mood and affect are normal. Speech and behavior are normal.  ____________________________________________  EKG: Interpreted by me. Sinus rhythm with rate of 90 bpm, normal. We'll, normal QRS, normal QT.  ____________________________________________  ED COURSE:  Pertinent labs & imaging results that were available during my care of the patient were reviewed by me and considered in my medical decision making (see chart for details). Patient presents for  back pain that radiates into his chest, we will assess with labs and imaging as indicated.   Procedures ____________________________________________   LABS (pertinent positives/negatives)  Labs Reviewed  COMPREHENSIVE METABOLIC PANEL - Abnormal; Notable for the following:       Result Value   Sodium 134 (*)    Chloride 100 (*)    Glucose, Bld 243 (*)    AST 52 (*)    All other components within  normal limits  CBC  TROPONIN I  LIPASE, BLOOD  TROPONIN I  URINALYSIS, COMPLETE (UACMP) WITH MICROSCOPIC    RADIOLOGY  Chest x-ray is normal CT angiogram of the chest isNegative for dissection  ____________________________________________  FINAL ASSESSMENT AND PLAN  Chest pain, back pain  Plan: Patient's labs and imaging were dictated above. Patient had presented for back pain that radiated into his chest. Initial workup was negative. Repeat troponin and CT angiogram for dissection are both negative. This is likely musculoskeletal in origin. He'll be discharged with muscle relaxants and outpatient follow-up.   Emily FilbertWilliams, Tremont Gavitt E, MD   Note: This note was generated in part or whole with voice recognition software. Voice recognition is usually quite accurate but there are transcription errors that can and very often do occur. I apologize for any typographical errors that were not detected and corrected.     Emily FilbertWilliams, Yuna Pizzolato E, MD 01/04/17 2230    Emily FilbertWilliams, Mone Commisso E, MD 01/04/17 [phone]

## 2017-01-05 NOTE — ED Notes (Signed)
Pt upset with MD Mayford KnifeWilliams stating that "I know that something is wrong". This RN attempted to reassure pt that his results came back without any obvious chest pain or clots seen. Pt slightly relieved at the end of this conversation. Pt left ED ambulatory with steady gait.

## 2017-05-22 ENCOUNTER — Emergency Department
Admission: EM | Admit: 2017-05-22 | Discharge: 2017-06-10 | Disposition: E | Payer: Medicaid Other | Attending: Emergency Medicine | Admitting: Emergency Medicine

## 2017-05-22 DIAGNOSIS — Z7984 Long term (current) use of oral hypoglycemic drugs: Secondary | ICD-10-CM | POA: Insufficient documentation

## 2017-05-22 DIAGNOSIS — I469 Cardiac arrest, cause unspecified: Secondary | ICD-10-CM | POA: Diagnosis not present

## 2017-05-22 DIAGNOSIS — Z79899 Other long term (current) drug therapy: Secondary | ICD-10-CM | POA: Diagnosis not present

## 2017-05-22 DIAGNOSIS — I10 Essential (primary) hypertension: Secondary | ICD-10-CM | POA: Diagnosis not present

## 2017-05-22 DIAGNOSIS — F17210 Nicotine dependence, cigarettes, uncomplicated: Secondary | ICD-10-CM | POA: Diagnosis not present

## 2017-05-22 DIAGNOSIS — E119 Type 2 diabetes mellitus without complications: Secondary | ICD-10-CM | POA: Insufficient documentation

## 2017-05-22 DIAGNOSIS — J45909 Unspecified asthma, uncomplicated: Secondary | ICD-10-CM | POA: Diagnosis not present

## 2017-05-22 MED ORDER — EPINEPHRINE PF 1 MG/10ML IJ SOSY
PREFILLED_SYRINGE | INTRAMUSCULAR | Status: AC | PRN
  Administered 2017-05-22 (×2): 1 mg via INTRAVENOUS

## 2017-05-22 MED ORDER — EPINEPHRINE PF 1 MG/10ML IJ SOSY
PREFILLED_SYRINGE | INTRAMUSCULAR | Status: AC | PRN
  Administered 2017-05-22 (×5): 1 mg via INTRAVENOUS

## 2017-05-22 MED ORDER — SODIUM BICARBONATE 8.4 % IV SOLN
INTRAVENOUS | Status: AC | PRN
  Administered 2017-05-22: 50 meq via INTRAVENOUS

## 2017-05-22 MED FILL — Medication: Qty: 1 | Status: AC

## 2017-06-10 NOTE — ED Notes (Signed)
Chaplain is getting family back to room at this time.

## 2017-06-10 NOTE — ED Notes (Addendum)
Mellody DanceKeith, NT has assisted RN with body prep.

## 2017-06-10 NOTE — Code Documentation (Addendum)
Dr. Derrill KayGoodman US heart. Pt's heart is attempting to beat via the US. LUCAS back in place; CPR resumed. Pt's is still grayed in appearance.

## 2017-06-10 NOTE — Code Documentation (Signed)
Patient time of death occurred at 11-28-16 1547.

## 2017-06-10 NOTE — Code Documentation (Signed)
Rhythm and pulse check. Pt is has no palpable pulse.

## 2017-06-10 NOTE — Code Documentation (Signed)
Rhythm and pulse check. TOD called by Dr. Derrill KayGoodman. Pt remains in asystole with no pulse

## 2017-06-10 NOTE — Code Documentation (Addendum)
Revonda StandardAllison, RN performing CPR at this time. LUCUS stopped at this time due to some mechanical failure.

## 2017-06-10 NOTE — ED Notes (Signed)
Pt is unsure of funeral home at this time. Nursing supervisor Dewayne Hatchnn is aware. Nurse provided pt number to call nursing supervisor with funeral home decision. Pt's fiance stating that pt does not have any children or living family member. Nursing supervisor has been made aware. Pt's fiance stating that a friend would like to see him and Lunette StandsChaplain is stating that they are able to call and come and see him in the morgue. Pt's fiance is understanding and has taken pt's belongings into her possession.

## 2017-06-10 NOTE — Code Documentation (Addendum)
Pt still remains with no palpable pulse. CPR resumed

## 2017-06-10 NOTE — ED Triage Notes (Addendum)
Pt went out to shovel snow and was found approximately 10 minutes later unresponsive. EMS began CPR since pt was still warm on arrival. LUCAS in use. Pt is grayed in appearance. King airway has been placed. Respiratory is bagging pt with no difficulty. EMS gave 4 rounds of Epi and two doses of Amiodarone. Pt was shocked 5 times PTA.

## 2017-06-10 NOTE — ED Notes (Signed)
No pulse with pulse check at this time. LUCAS resuming CPR.

## 2017-06-10 NOTE — Code Documentation (Addendum)
Pulse check performed. Pt still remains pulseless. CPR resumed

## 2017-06-10 NOTE — Code Documentation (Signed)
Rhythm and pulse check. Pt has no palpable pulse at this time.

## 2017-06-10 NOTE — Code Documentation (Addendum)
Pulse check. Pt has a rhythm on monitor but no palpable pulse. CPR resumed

## 2017-06-10 NOTE — ED Provider Notes (Signed)
[facility] Emergency Department Provider Note  ____________________________________________   I have reviewed the triage vital signs and the nursing notes.   HISTORY  Chief Complaint Cardiac Arrest   History limited by and level 5 caveat due to cardiac arrest   HPI Michael Santana is a 57 y.o. male who presents to the emergency department today via EMS as emergency traffic undergoing CPR. History is obtained from EMS. Apparently the patient went outside to shovel snow. Family then found him roughly 10 minutes later, unresponsive, on the ground. When EMS first arrived the initial rhythm was ventricular fibrillation. They did shock the patient multiple times and also performed multiple rounds of CPR with epinephrine. They were never able to obtain a pulse. CPR had been ongoing for roughly 35-45 minutes prior to arrival. A king airway had been placed and lucas device was utilized. Glucose was in the 80s on scene. Patient has history of HTN and DM per EMS.   Past Medical History:  Diagnosis Date  . Asthma   . Diabetes mellitus without complication (HCC)   . Hypertension     There are no active problems to display for this patient.   Past Surgical History:  Procedure Laterality Date  . HERNIA REPAIR      Prior to Admission medications   Medication Sig Start Date End Date Taking? Authorizing Provider  diazepam (VALIUM) 2 MG tablet Take 1 tablet (2 mg total) by mouth every 8 (eight) hours as needed for muscle spasms. 10/15/15   Tommi RumpsSummers, Rhonda L, PA-C  diazepam (VALIUM) 5 MG tablet Take 1 tablet (5 mg total) by mouth every 8 (eight) hours as needed for muscle spasms. 01/04/17   Emily FilbertWilliams, Jonathan E, MD  escitalopram (LEXAPRO) 10 MG tablet Take 10 mg by mouth daily.    [provider]  glipiZIDE (GLUCOTROL) 10 MG tablet Take 10 mg by mouth 2 (two) times daily.    [provider]  metFORMIN (GLUCOPHAGE-XR) 500 MG 24 hr tablet Take 1,000 mg by  mouth 2 (two) times daily.    [provider]  oxyCODONE-acetaminophen (PERCOCET) 5-325 MG tablet Take 1 tablet by mouth every 4 (four) hours as needed for severe pain. 10/15/15   Tommi RumpsSummers, Rhonda L, PA-C    Allergies Tramadol  No family history on file.  Social History Social History   Tobacco Use  . Smoking status: Current Every Day Smoker    Packs/day: 1.00    Years: 47.00    Pack years: 47.00    Types: Cigarettes  . Smokeless tobacco: Never Used  Substance Use Topics  . Alcohol use: No  . Drug use: Yes    Comment: pt reports having taken percocet and xanax in the past. Pt currently attempting to quite .    Review of Systems Unable to obtain given medical condition.  ____________________________________________   PHYSICAL EXAM:  VITAL SIGNS: ED Triage Vitals [08-17-2016 1521]  Enc Vitals Group     BP      Pulse      Resp      Temp      Temp src      SpO2      Weight      Height      Head Circumference      Peak Flow      Pain Score      Pain Loc      Pain Edu?      Excl. in GC?  Constitutional: Unresponsive. Eyes: Pupils fixed.  ENT   Head: Normocephalic and atraumatic.   Nose: No congestion/rhinnorhea.   Mouth/Throat: King airway in place.   Neck: No stridor. Hematological/Lymphatic/Immunilogical: No cervical lymphadenopathy. Cardiovascular: Samuel BoucheLucas device in place.  Respiratory: King airway in place.  Gastrointestinal: Soft Genitourinary: Deferred Musculoskeletal: No lower extremity edema. Neurologic:  Unresponsive. Skin:  Skin is slightly cyanotic and mottled. Psychiatric: Unresponsive  ____________________________________________    LABS (pertinent positives/negatives)  None  ____________________________________________   EKG  None  ____________________________________________    RADIOLOGY  None  ____________________________________________   PROCEDURES  Procedures   CRITICAL CARE Performed  by: Phineas SemenGOODMAN, Kortnie Stovall Total critical care time: 45 Critical care time was exclusive of separately billable procedures and treating other patients. Critical care was necessary to treat or prevent imminent or life-threatening deterioration. Critical care was time spent personally by me on the following activities: development of treatment plan with patient and/or surrogate as well as nursing, discussions with consultants, evaluation of patient's response to treatment, examination of patient, obtaining history from patient or surrogate, ordering and performing treatments and interventions, ordering and review of laboratory studies, ordering and review of radiographic studies, pulse oximetry and re-evaluation of patient's condition.   ____________________________________________   INITIAL IMPRESSION / ASSESSMENT AND PLAN / ED COURSE  Pertinent labs & imaging results that were available during my care of the patient were reviewed by me and considered in my medical decision making (see chart for details).  Patient presented to the emergency department today for cardiopulmonary arrest.  The patient had undergone CPR by EMS for over 30 minutes.  Upon arrival to the emergency department here he was still unresponsive without a pulse.  Initial rhythm was PEA with very sparse what appeared to be wide-complex beats.  We did attempt multiple rounds of CPR here in the emergency department.  We checked both the temperature and glucose which did not seem to be the etiology.  Having never successfully regaining a pulse the patient was pronounced dead.  I did discuss to the best my ability what EMS as well as the team here in the emergency department did for the patient to family and friend.   ____________________________________________   FINAL CLINICAL IMPRESSION(S) / ED DIAGNOSES  Final diagnoses:  Cardiopulmonary arrest [facility](HCC)     Note: This dictation was prepared with Nurse, children'sDragon dictation. Any  transcriptional errors that result from this process are unintentional     Phineas SemenGoodman, Milika Ventress, MD 2017-05-04 2005

## 2017-06-10 NOTE — Code Documentation (Addendum)
Pulse check. No pulse present. CPR resumed

## 2017-06-10 NOTE — ED Notes (Signed)
PCP to sign of on death certificate [place]. Pt has had tubes and machinery removed. Dr. Derrill KayGoodman has spoken to pt's significant other about pt's outcomes.

## 2017-06-10 DEATH — deceased

## 2019-06-09 IMAGING — CT CT ANGIO CHEST-ABD-PELV FOR DISSECTION W/ AND WO/W CM
2 of 7 series · 14 of 46 positions shown, 16 images · IV contrast (APPLIED)
Comparison: None.

CLINICAL DATA: Left-sided chest pain radiating to the back. Chronic
dyspnea.

EXAM:
CT ANGIOGRAPHY CHEST, ABDOMEN AND PELVIS
TECHNIQUE: Multidetector CT imaging through the chest, abdomen and pelvis was
performed using the standard protocol during bolus administration of
intravenous contrast. Multiplanar reconstructed images and MIPs were
obtained and reviewed to evaluate the vascular anatomy.
CONTRAST:  100 mL Isovue 370 intravenous

[Series 6: axial arterial · axial · arterial · 0.93mm/px · z∈[-969,-357]mm · 11 of 230 slices shown, 13 images]
[im 13/230  soft-tissue]
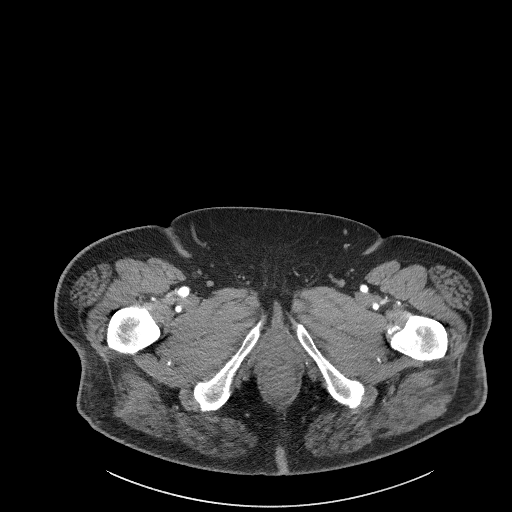
[im 13/230  bone]
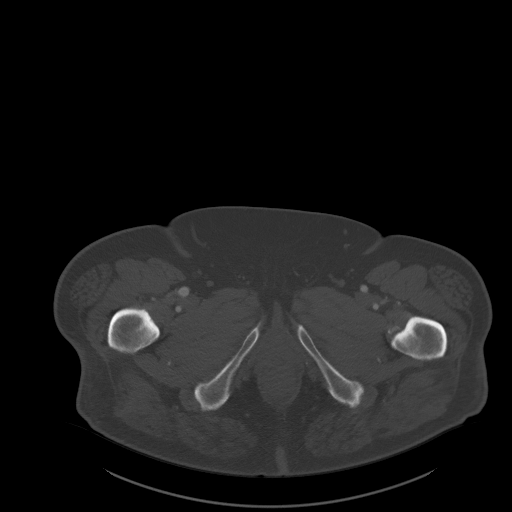
[im 37/230  soft-tissue]
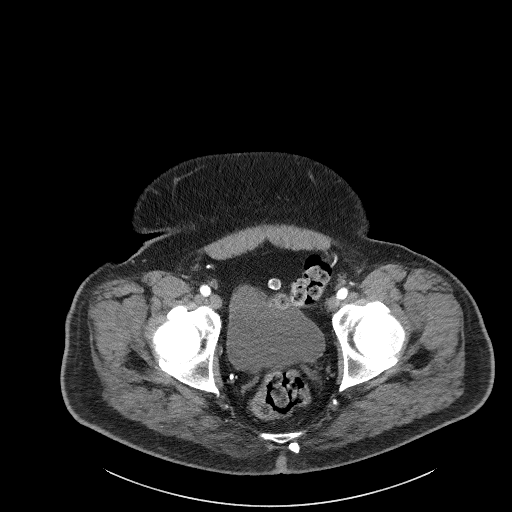
[im 61/230  soft-tissue]
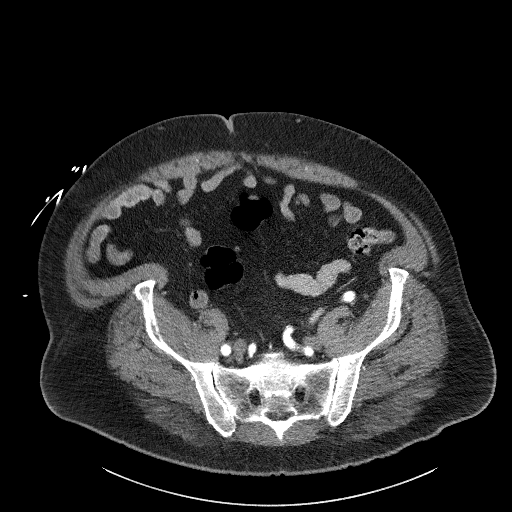
[im 73/230  soft-tissue]
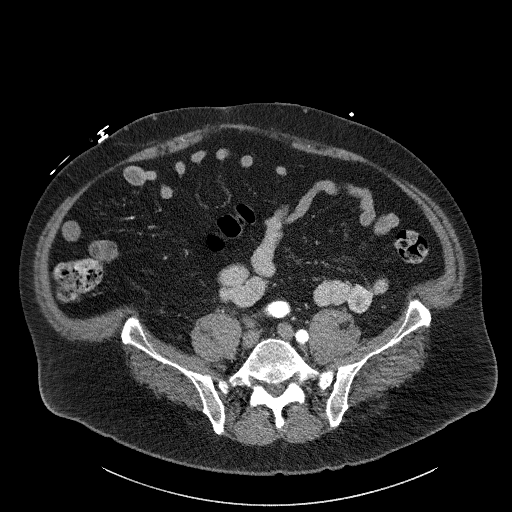
[im 97/230  soft-tissue]
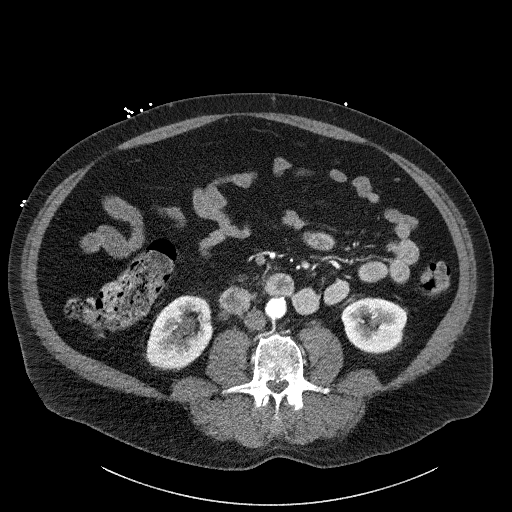
[im 121/230  soft-tissue]
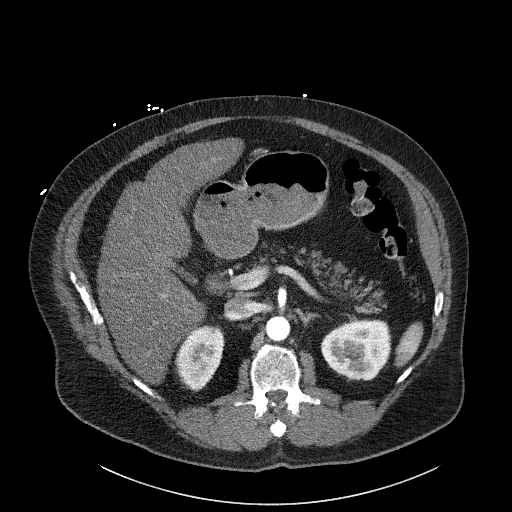
[im 133/230  soft-tissue]
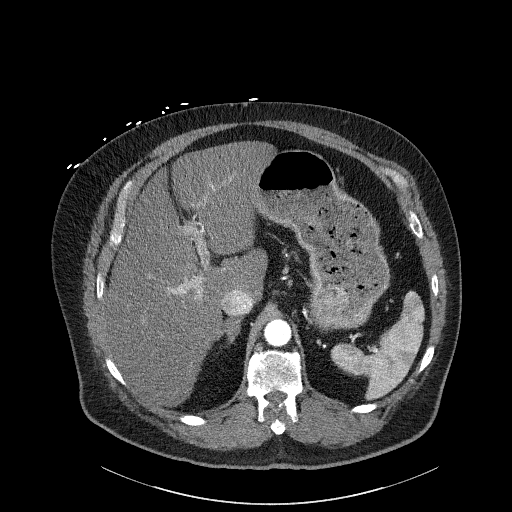
[im 157/230  soft-tissue]
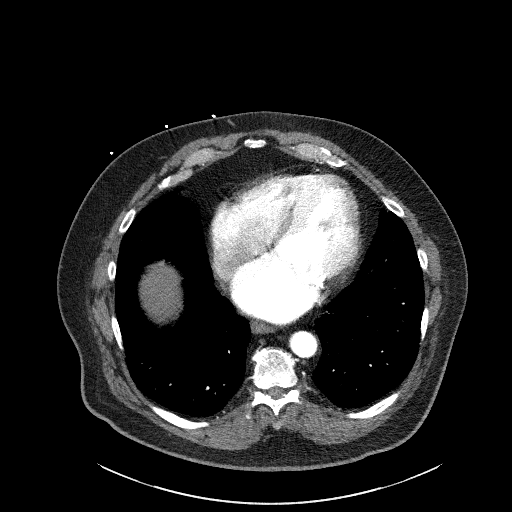
[im 169/230  soft-tissue]
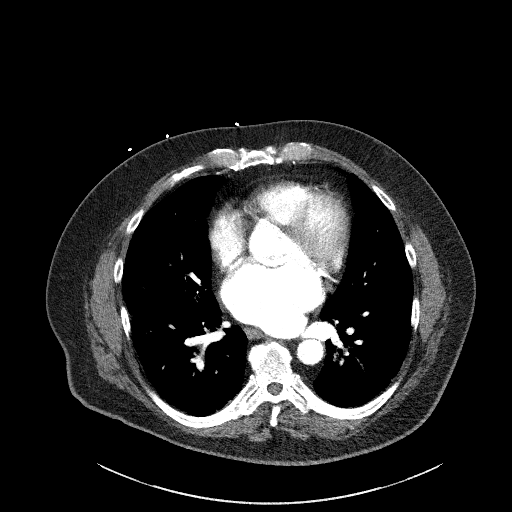
[im 169/230  bone]
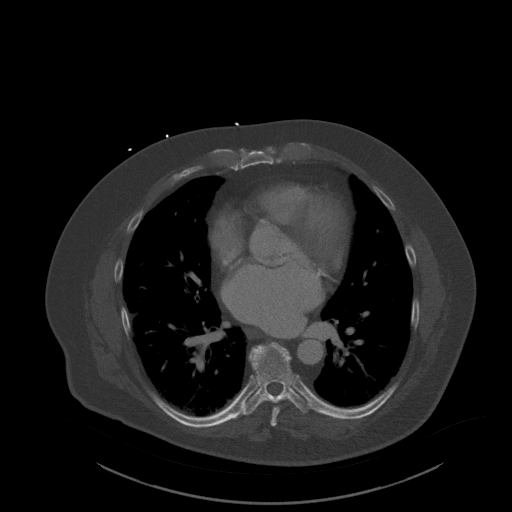
[im 193/230  soft-tissue]
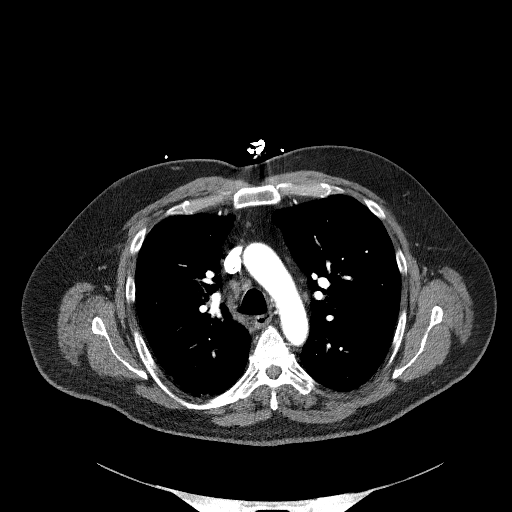
[im 217/230  soft-tissue]
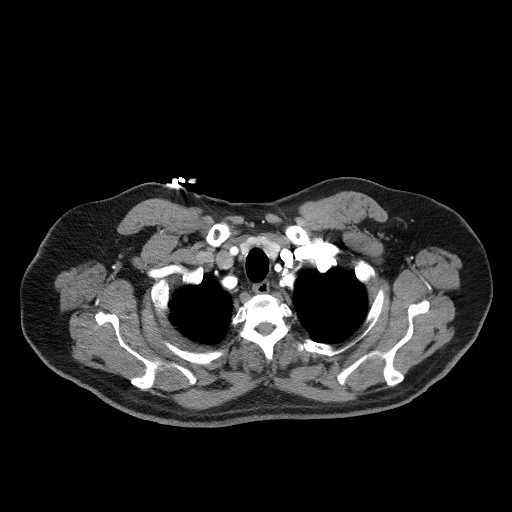

[Series 8: coronals · coronal · 0.87mm/px · 3 of 177 slices shown]
[im 45/177  soft-tissue]
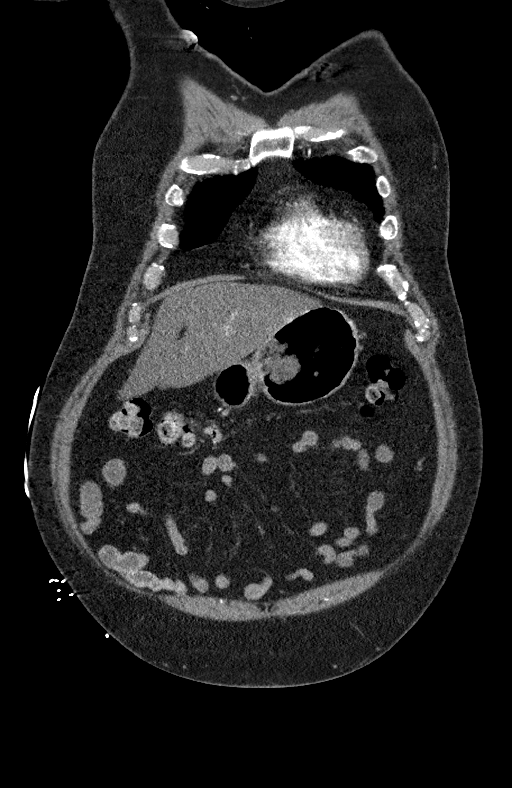
[im 89/177  soft-tissue]
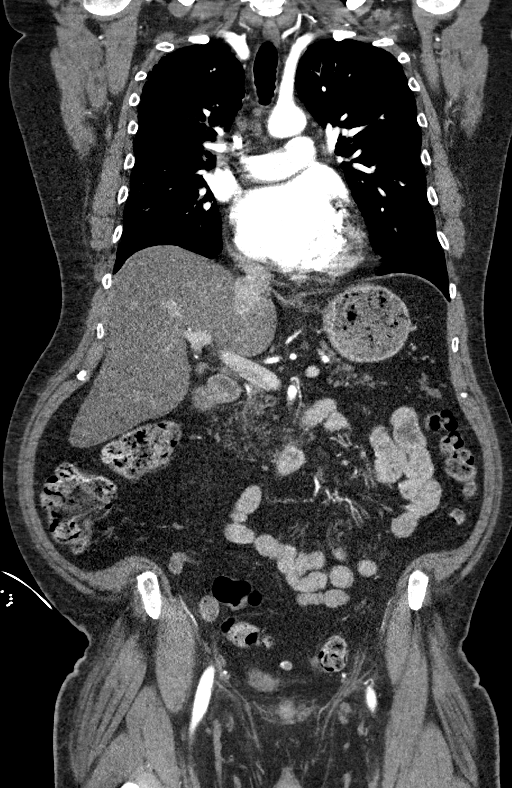
[im 133/177  soft-tissue]
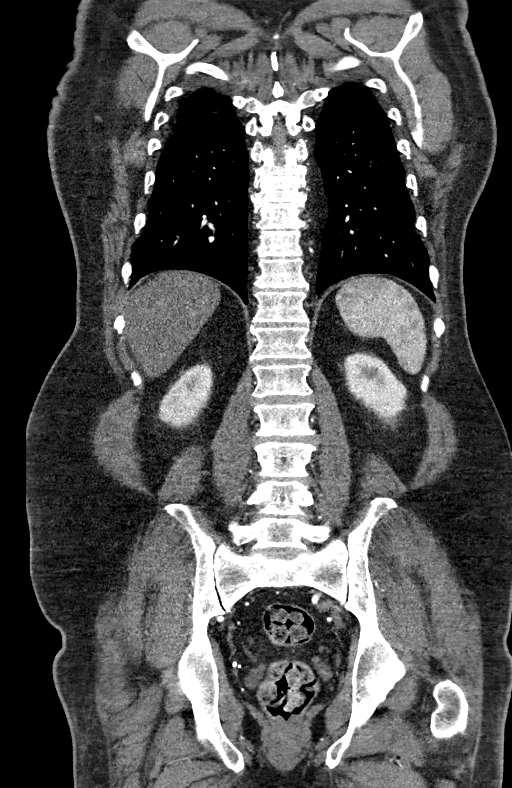

[14 of 46 positions shown; findings below may reference images not displayed]

FINDINGS: CTA CHEST FINDINGS

Cardiovascular: Preferential opacification of the thoracic aorta. No
evidence of thoracic aortic aneurysm or dissection. Normal heart
size. No pericardial effusion. The pulmonary arteries are also well
opacified. No pulmonary emboli.

Mediastinum/Nodes: No enlarged mediastinal, hilar, or axillary lymph
nodes. Thyroid gland, trachea, and esophagus demonstrate no
significant findings.

Lungs/Pleura: Emphysematous changes, upper lobe predominant. No
airspace consolidation. Airways are patent and normal in caliber.

Musculoskeletal: No significant skeletal lesion.

Review of the MIP images confirms the above findings.

CTA ABDOMEN AND PELVIS FINDINGS

VASCULAR

Aorta: Normal caliber aorta without aneurysm, dissection, vasculitis
or significant stenosis.

Celiac: Patent without evidence of aneurysm, dissection, vasculitis
or significant stenosis.

SMA: Patent without evidence of aneurysm, dissection, vasculitis or
significant stenosis.

Renals: Both renal arteries are patent without evidence of aneurysm,
dissection, vasculitis, fibromuscular dysplasia or significant
stenosis.

IMA: Patent without evidence of aneurysm, dissection, vasculitis or
significant stenosis.

Inflow: Patent without evidence of aneurysm, dissection, vasculitis
or significant stenosis.

Veins: No obvious venous abnormality within the limitations of this
arterial phase study.

Review of the MIP images confirms the above findings.

NON-VASCULAR

Hepatobiliary: Diffuse fatty infiltration of the liver. Unremarkable
gallbladder and bile ducts.

Pancreas: Unremarkable. No pancreatic ductal dilatation or
surrounding inflammatory changes.

Spleen: Normal in size without focal abnormality.

Adrenals/Urinary Tract: Adrenal glands are unremarkable. Kidneys are
normal, without renal calculi, focal lesion, or hydronephrosis.
Bladder is unremarkable.

Stomach/Bowel: Stomach is within normal limits. Appendix is normal.
No evidence of bowel wall thickening, distention, or inflammatory
changes.

Lymphatic: No significant vascular findings are present. No enlarged
abdominal or pelvic lymph nodes.

Reproductive: Unremarkable

Other: No focal inflammation.  No ascites.

Musculoskeletal: No significant skeletal lesion.

Review of the MIP images confirms the above findings.
IMPRESSION: 1. Normal caliber aorta. No dissection. No stenosis. Only minimal
atherosclerotic calcification.
2. Emphysematous disease in both lungs, upper lobe predominant.
3. Hepatic steatosis.

## 2019-06-09 IMAGING — CR DG CHEST 2V
2 series · 2 of 2 positions shown · non-contrast
Comparison: Chest radiograph 07/10/2015

CLINICAL DATA: Chest pain radiating to the back. Shortness of
breath.

EXAM:
CHEST  2 VIEW

[chest pa]
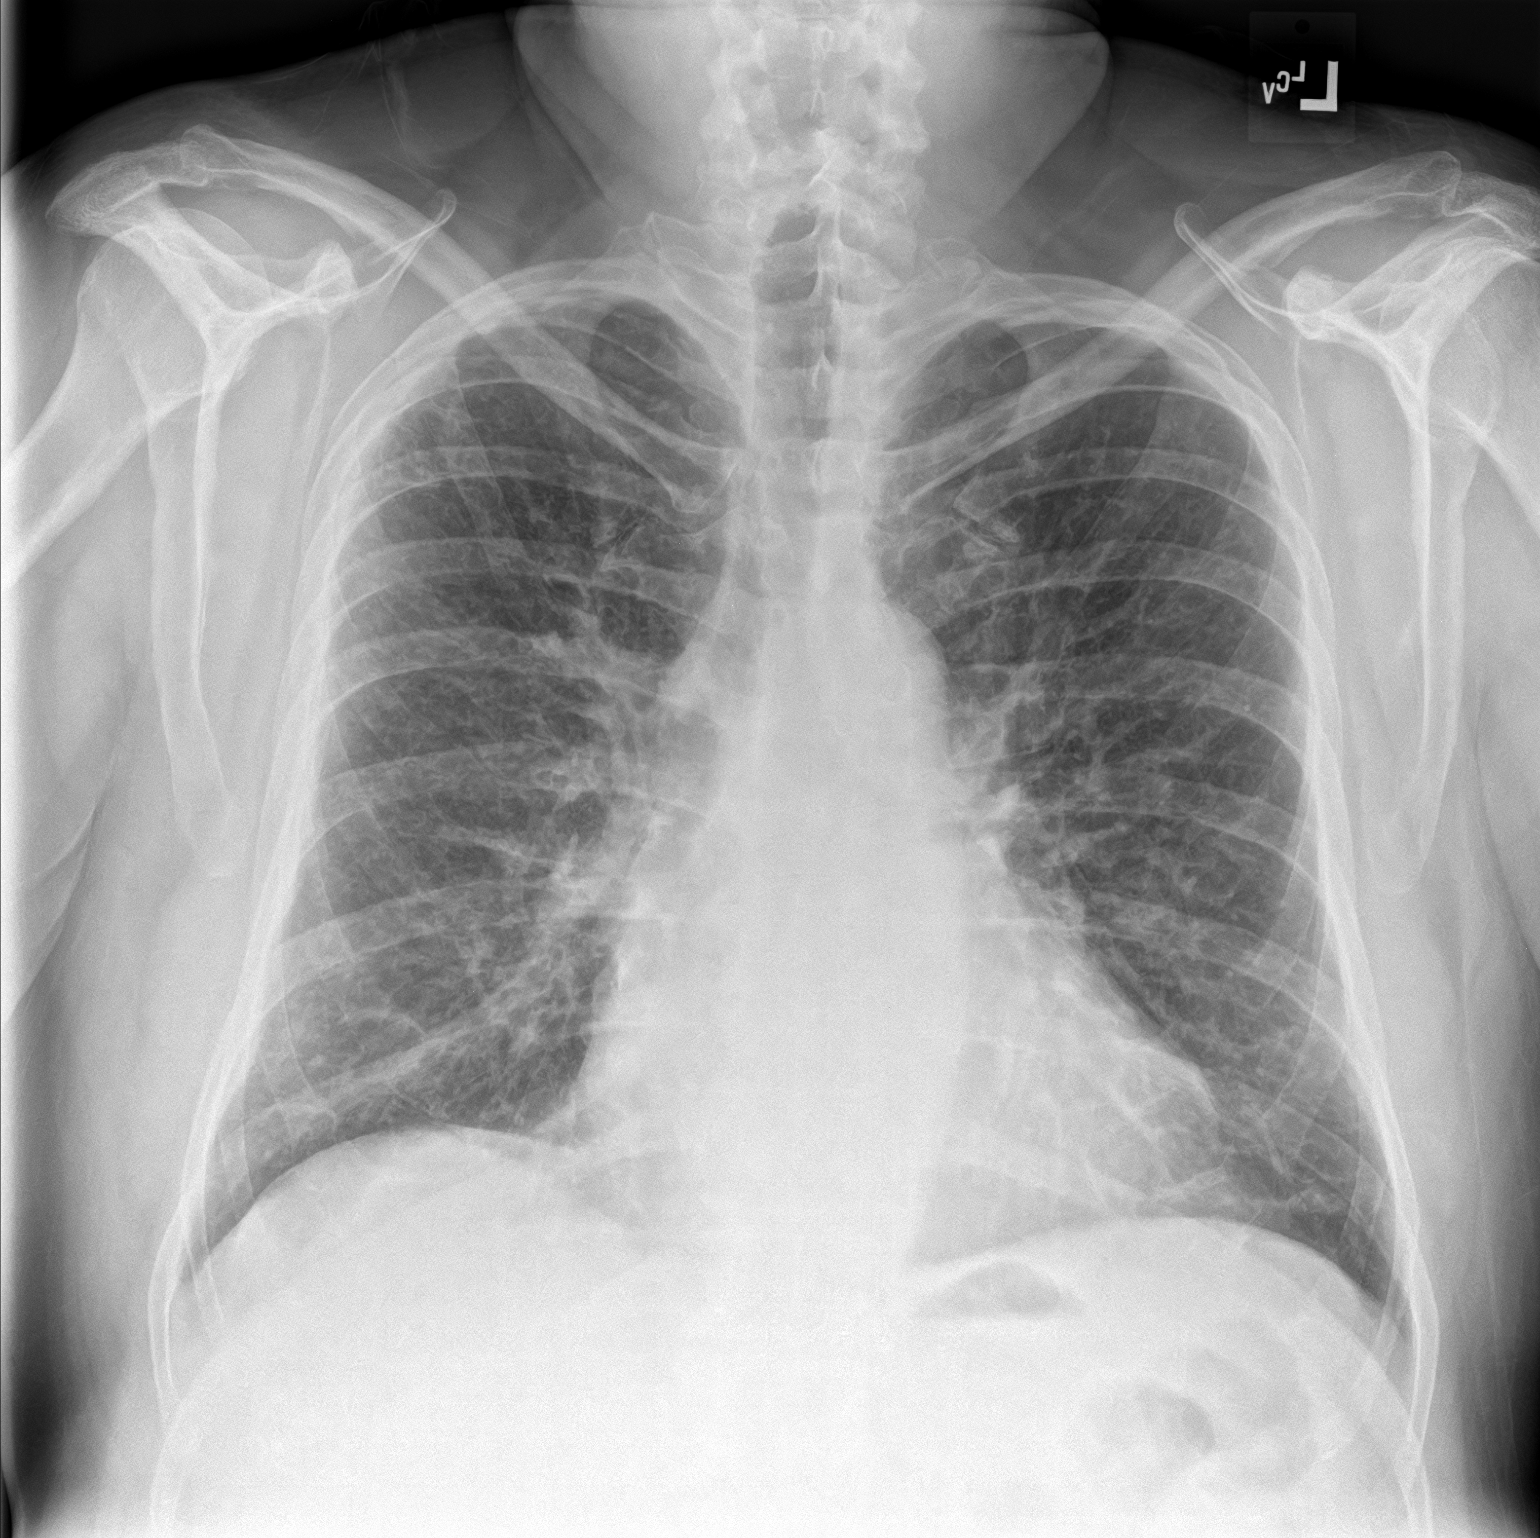

[chest lat]
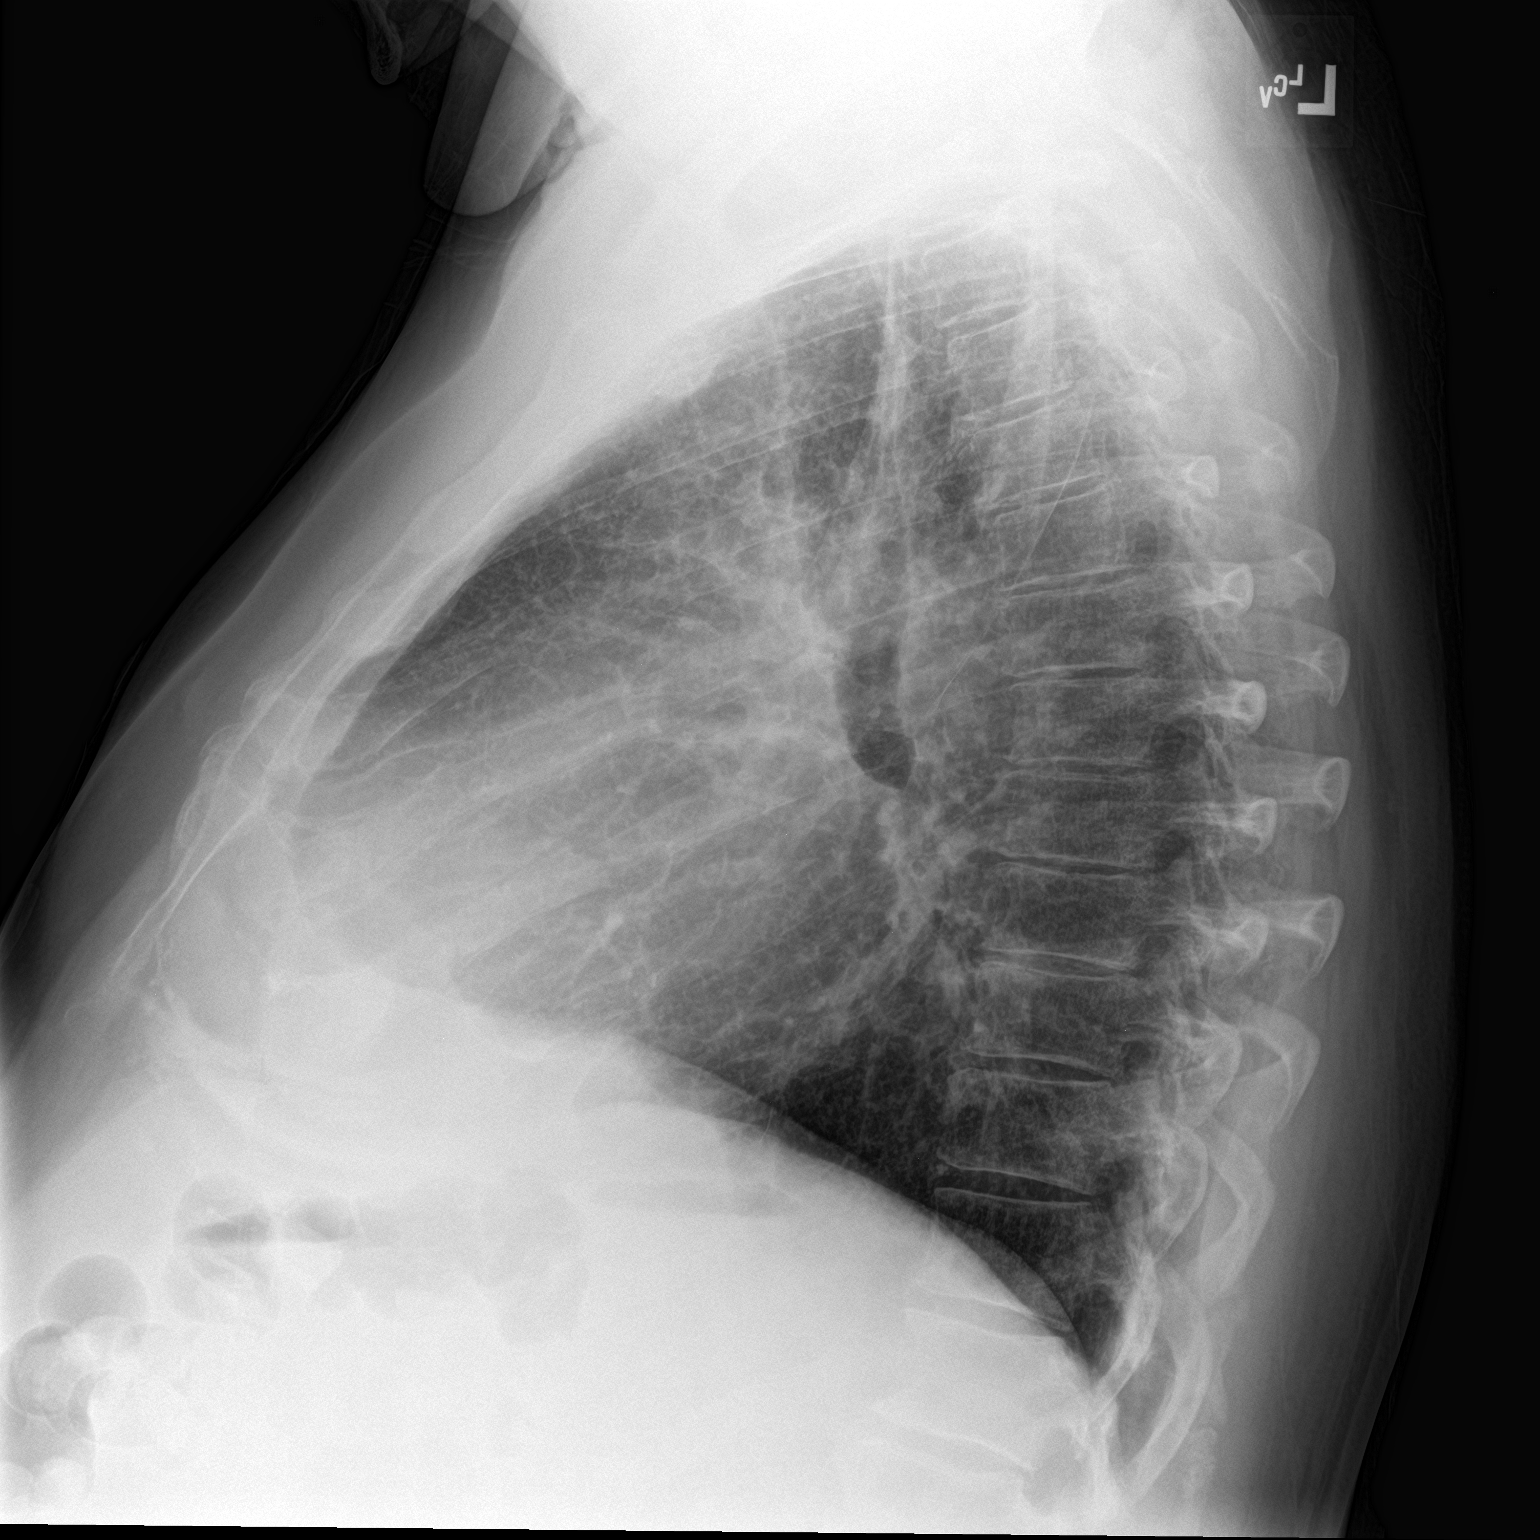

[2 of 2 positions shown; findings below may reference images not displayed]

FINDINGS: The heart size and mediastinal contours are within normal limits.
Both lungs are clear of consolidation or pulmonary edema. The
visualized skeletal structures are unremarkable.
IMPRESSION: No active cardiopulmonary disease.
